# Patient Record
Sex: Male | Born: 1935 | Race: White | Hispanic: No | Marital: Married | Smoking: Never smoker
Health system: Southern US, Community
[De-identification: ages and names within clinical notes are randomized; demographics above are authoritative.]

## PROBLEM LIST (undated history)

## (undated) DIAGNOSIS — R7309 Other abnormal glucose: Secondary | ICD-10-CM

## (undated) DIAGNOSIS — E559 Vitamin D deficiency, unspecified: Secondary | ICD-10-CM

## (undated) DIAGNOSIS — H5702 Anisocoria: Secondary | ICD-10-CM

## (undated) DIAGNOSIS — E785 Hyperlipidemia, unspecified: Secondary | ICD-10-CM

## (undated) DIAGNOSIS — K219 Gastro-esophageal reflux disease without esophagitis: Secondary | ICD-10-CM

## (undated) DIAGNOSIS — D649 Anemia, unspecified: Secondary | ICD-10-CM

## (undated) HISTORY — DX: Gastro-esophageal reflux disease without esophagitis: K21.9

## (undated) HISTORY — DX: Anisocoria: H57.02

## (undated) HISTORY — DX: Hyperlipidemia, unspecified: E78.5

## (undated) HISTORY — DX: Other abnormal glucose: R73.09

## (undated) HISTORY — DX: Anemia, unspecified: D64.9

## (undated) HISTORY — PX: CATARACT EXTRACTION: SUR2

## (undated) HISTORY — DX: Vitamin D deficiency, unspecified: E55.9

## (undated) HISTORY — PX: COLONOSCOPY: SHX174

## (undated) SURGERY — VIDEO BRONCHOSCOPY WITHOUT FLUORO
Anesthesia: General

---

## 1996-08-30 HISTORY — PX: OTHER SURGICAL HISTORY: SHX169

## 2001-10-21 ENCOUNTER — Ambulatory Visit (HOSPITAL_COMMUNITY): Admission: RE | Admit: 2001-10-21 | Discharge: 2001-10-21 | Payer: Self-pay | Admitting: Internal Medicine

## 2001-10-21 ENCOUNTER — Encounter: Payer: Self-pay | Admitting: Internal Medicine

## 2003-01-21 ENCOUNTER — Encounter: Admission: RE | Admit: 2003-01-21 | Discharge: 2003-01-21 | Payer: Self-pay | Admitting: Internal Medicine

## 2003-01-21 ENCOUNTER — Encounter: Payer: Self-pay | Admitting: Internal Medicine

## 2003-01-25 ENCOUNTER — Encounter: Admission: RE | Admit: 2003-01-25 | Discharge: 2003-01-25 | Payer: Self-pay | Admitting: Internal Medicine

## 2003-01-25 ENCOUNTER — Encounter: Payer: Self-pay | Admitting: Internal Medicine

## 2004-09-25 ENCOUNTER — Encounter (INDEPENDENT_AMBULATORY_CARE_PROVIDER_SITE_OTHER): Payer: Self-pay | Admitting: Specialist

## 2004-09-25 ENCOUNTER — Ambulatory Visit (HOSPITAL_COMMUNITY): Admission: RE | Admit: 2004-09-25 | Discharge: 2004-09-25 | Payer: Self-pay | Admitting: *Deleted

## 2004-10-13 ENCOUNTER — Ambulatory Visit: Payer: Self-pay | Admitting: Internal Medicine

## 2004-10-22 ENCOUNTER — Ambulatory Visit: Payer: Self-pay

## 2004-11-03 ENCOUNTER — Ambulatory Visit: Payer: Self-pay | Admitting: Internal Medicine

## 2004-11-10 ENCOUNTER — Ambulatory Visit: Payer: Self-pay | Admitting: Internal Medicine

## 2005-01-26 ENCOUNTER — Ambulatory Visit: Payer: Self-pay | Admitting: Internal Medicine

## 2005-02-19 ENCOUNTER — Ambulatory Visit: Payer: Self-pay | Admitting: Internal Medicine

## 2005-03-11 ENCOUNTER — Encounter: Admission: RE | Admit: 2005-03-11 | Discharge: 2005-06-09 | Payer: Self-pay | Admitting: Internal Medicine

## 2006-02-04 ENCOUNTER — Ambulatory Visit: Payer: Self-pay | Admitting: Internal Medicine

## 2006-02-25 ENCOUNTER — Ambulatory Visit: Payer: Self-pay | Admitting: Internal Medicine

## 2006-04-05 ENCOUNTER — Ambulatory Visit: Payer: Self-pay | Admitting: Internal Medicine

## 2006-10-05 ENCOUNTER — Ambulatory Visit: Payer: Self-pay | Admitting: Internal Medicine

## 2006-10-05 LAB — CONVERTED CEMR LAB
ALT: 25 units/L (ref 0–40)
AST: 27 units/L (ref 0–37)
Cholesterol: 179 mg/dL (ref 0–200)
HDL: 36.1 mg/dL — ABNORMAL LOW (ref >39.0)
Hgb A1c MFr Bld: 6.2 % — ABNORMAL HIGH (ref 4.6–6.0)
LDL Cholesterol: 106 mg/dL — ABNORMAL HIGH (ref 0–99)
Total CHOL/HDL Ratio: 5
Triglycerides: 183 mg/dL — ABNORMAL HIGH (ref 0–149)
VLDL: 37 mg/dL (ref 0–40)
Vit D, 1,25-Dihydroxy: 10 — ABNORMAL LOW (ref 20–57)

## 2006-11-23 ENCOUNTER — Ambulatory Visit: Payer: Self-pay | Admitting: Internal Medicine

## 2008-02-21 ENCOUNTER — Encounter: Payer: Self-pay | Admitting: Internal Medicine

## 2008-03-12 ENCOUNTER — Ambulatory Visit: Payer: Self-pay | Admitting: Internal Medicine

## 2009-02-24 ENCOUNTER — Encounter: Payer: Self-pay | Admitting: Internal Medicine

## 2009-03-17 ENCOUNTER — Ambulatory Visit: Payer: Self-pay | Admitting: Internal Medicine

## 2009-03-17 DIAGNOSIS — H5702 Anisocoria: Secondary | ICD-10-CM | POA: Insufficient documentation

## 2009-03-17 DIAGNOSIS — E785 Hyperlipidemia, unspecified: Secondary | ICD-10-CM

## 2009-03-17 DIAGNOSIS — R7309 Other abnormal glucose: Secondary | ICD-10-CM | POA: Insufficient documentation

## 2009-03-17 DIAGNOSIS — R9431 Abnormal electrocardiogram [ECG] [EKG]: Secondary | ICD-10-CM

## 2009-03-17 DIAGNOSIS — E559 Vitamin D deficiency, unspecified: Secondary | ICD-10-CM

## 2009-03-17 DIAGNOSIS — E781 Pure hyperglyceridemia: Secondary | ICD-10-CM

## 2009-03-26 ENCOUNTER — Telehealth (INDEPENDENT_AMBULATORY_CARE_PROVIDER_SITE_OTHER): Payer: Self-pay | Admitting: *Deleted

## 2009-03-26 ENCOUNTER — Encounter (INDEPENDENT_AMBULATORY_CARE_PROVIDER_SITE_OTHER): Payer: Self-pay | Admitting: *Deleted

## 2009-04-02 ENCOUNTER — Encounter: Payer: Self-pay | Admitting: Internal Medicine

## 2009-06-12 ENCOUNTER — Ambulatory Visit: Payer: Self-pay | Admitting: Internal Medicine

## 2009-06-13 ENCOUNTER — Telehealth (INDEPENDENT_AMBULATORY_CARE_PROVIDER_SITE_OTHER): Payer: Self-pay | Admitting: *Deleted

## 2009-06-13 ENCOUNTER — Encounter (INDEPENDENT_AMBULATORY_CARE_PROVIDER_SITE_OTHER): Payer: Self-pay | Admitting: *Deleted

## 2009-06-13 LAB — CONVERTED CEMR LAB
Cholesterol: 167 mg/dL (ref 0–200)
Direct LDL: 70.5 mg/dL
Triglycerides: 403 mg/dL — ABNORMAL HIGH (ref 0.0–149.0)

## 2009-07-02 ENCOUNTER — Telehealth: Payer: Self-pay | Admitting: Internal Medicine

## 2009-08-11 ENCOUNTER — Ambulatory Visit: Payer: Self-pay | Admitting: Internal Medicine

## 2009-08-30 HISTORY — PX: PENILE PROSTHESIS IMPLANT: SHX240

## 2009-10-01 ENCOUNTER — Ambulatory Visit: Payer: Self-pay | Admitting: Internal Medicine

## 2009-10-06 LAB — CONVERTED CEMR LAB
AST: 26 units/L (ref 0–37)
Albumin: 4.1 g/dL (ref 3.5–5.2)
Alkaline Phosphatase: 58 units/L (ref 39–117)
Bilirubin, Direct: 0.2 mg/dL (ref 0.0–0.3)
Total Protein: 7 g/dL (ref 6.0–8.3)

## 2010-01-20 ENCOUNTER — Ambulatory Visit: Payer: Self-pay | Admitting: Internal Medicine

## 2010-01-20 DIAGNOSIS — K219 Gastro-esophageal reflux disease without esophagitis: Secondary | ICD-10-CM

## 2010-01-20 DIAGNOSIS — M7580 Other shoulder lesions, unspecified shoulder: Secondary | ICD-10-CM

## 2010-01-21 ENCOUNTER — Encounter: Admission: RE | Admit: 2010-01-21 | Discharge: 2010-02-18 | Payer: Self-pay | Admitting: Internal Medicine

## 2010-01-22 ENCOUNTER — Encounter: Payer: Self-pay | Admitting: Internal Medicine

## 2010-01-27 LAB — CONVERTED CEMR LAB
AST: 29 units/L (ref 0–37)
Albumin: 4 g/dL (ref 3.5–5.2)
Alkaline Phosphatase: 65 units/L (ref 39–117)
Bilirubin, Direct: 0.1 mg/dL (ref 0.0–0.3)
Calcium: 8.8 mg/dL (ref 8.4–10.5)
GFR calc non Af Amer: 77.94 mL/min (ref >60)
Glucose, Bld: 85 mg/dL (ref 70–99)
HDL: 36.2 mg/dL — ABNORMAL LOW (ref >39.00)
Potassium: 4.3 meq/L (ref 3.5–5.1)
Sodium: 141 meq/L (ref 135–145)
Total Bilirubin: 0.6 mg/dL (ref 0.3–1.2)
Total CHOL/HDL Ratio: 5
Triglycerides: 237 mg/dL — ABNORMAL HIGH (ref 0.0–149.0)

## 2010-02-17 ENCOUNTER — Telehealth (INDEPENDENT_AMBULATORY_CARE_PROVIDER_SITE_OTHER): Payer: Self-pay | Admitting: *Deleted

## 2010-02-18 ENCOUNTER — Encounter: Payer: Self-pay | Admitting: Internal Medicine

## 2010-03-10 ENCOUNTER — Encounter: Admission: RE | Admit: 2010-03-10 | Discharge: 2010-03-10 | Payer: Self-pay | Admitting: Urology

## 2010-03-13 ENCOUNTER — Ambulatory Visit (HOSPITAL_BASED_OUTPATIENT_CLINIC_OR_DEPARTMENT_OTHER): Admission: RE | Admit: 2010-03-13 | Discharge: 2010-03-13 | Payer: Self-pay | Admitting: Urology

## 2010-07-14 ENCOUNTER — Ambulatory Visit: Payer: Self-pay | Admitting: Internal Medicine

## 2010-07-20 LAB — CONVERTED CEMR LAB
HDL: 33.5 mg/dL — ABNORMAL LOW (ref >39.00)
Hgb A1c MFr Bld: 6.9 % — ABNORMAL HIGH (ref 4.6–6.5)
LDL Cholesterol: 96 mg/dL (ref 0–99)
Total CHOL/HDL Ratio: 5
Triglycerides: 173 mg/dL — ABNORMAL HIGH (ref 0.0–149.0)
VLDL: 34.6 mg/dL (ref 0.0–40.0)

## 2010-07-31 ENCOUNTER — Ambulatory Visit: Payer: Self-pay | Admitting: Internal Medicine

## 2010-07-31 ENCOUNTER — Ambulatory Visit: Payer: Self-pay | Admitting: Cardiovascular Disease

## 2010-07-31 DIAGNOSIS — R6883 Chills (without fever): Secondary | ICD-10-CM | POA: Insufficient documentation

## 2010-07-31 DIAGNOSIS — E119 Type 2 diabetes mellitus without complications: Secondary | ICD-10-CM | POA: Insufficient documentation

## 2010-07-31 DIAGNOSIS — R000 Tachycardia, unspecified: Secondary | ICD-10-CM

## 2010-08-03 ENCOUNTER — Encounter: Payer: Self-pay | Admitting: Internal Medicine

## 2010-08-03 ENCOUNTER — Ambulatory Visit: Payer: Self-pay | Admitting: Internal Medicine

## 2010-08-03 DIAGNOSIS — N390 Urinary tract infection, site not specified: Secondary | ICD-10-CM

## 2010-08-03 DIAGNOSIS — D649 Anemia, unspecified: Secondary | ICD-10-CM

## 2010-08-03 DIAGNOSIS — R918 Other nonspecific abnormal finding of lung field: Secondary | ICD-10-CM

## 2010-08-03 LAB — CONVERTED CEMR LAB
BUN: 19 mg/dL (ref 6–23)
Basophils Absolute: 0 10*3/uL (ref 0.0–0.1)
Basophils Relative: 0 % (ref 0.0–3.0)
Chloride: 102 meq/L (ref 96–112)
Eosinophils Absolute: 0.2 10*3/uL (ref 0.0–0.7)
GFR calc non Af Amer: 60.72 mL/min (ref >60)
Lymphocytes Relative: 7.3 % — ABNORMAL LOW (ref 12.0–46.0)
MCHC: 33.3 g/dL (ref 30.0–36.0)
MCV: 84.7 fL (ref 78.0–100.0)
Monocytes Absolute: 0.5 10*3/uL (ref 0.1–1.0)
Neutrophils Relative %: 85 % — ABNORMAL HIGH (ref 43.0–77.0)
Platelets: 271 10*3/uL (ref 150.0–400.0)
Potassium: 3.8 meq/L (ref 3.5–5.1)
RBC: 4.41 M/uL (ref 4.22–5.81)
RDW: 15.1 % — ABNORMAL HIGH (ref 11.5–14.6)
Relative Index: 1.4 (ref 0.0–2.5)
Saturation Ratios: 10.1 % — ABNORMAL LOW (ref 20.0–50.0)
TSH: 2.38 microintl units/mL (ref 0.35–5.50)
Total CK: 139 units/L (ref 7–232)
Transferrin: 345.6 mg/dL (ref 212.0–360.0)

## 2010-08-04 ENCOUNTER — Telehealth (INDEPENDENT_AMBULATORY_CARE_PROVIDER_SITE_OTHER): Payer: Self-pay | Admitting: *Deleted

## 2010-08-04 ENCOUNTER — Telehealth: Payer: Self-pay | Admitting: Internal Medicine

## 2010-08-12 ENCOUNTER — Telehealth (INDEPENDENT_AMBULATORY_CARE_PROVIDER_SITE_OTHER): Payer: Self-pay | Admitting: *Deleted

## 2010-08-13 ENCOUNTER — Encounter: Payer: Self-pay | Admitting: Internal Medicine

## 2010-08-13 ENCOUNTER — Ambulatory Visit: Payer: Self-pay | Admitting: Internal Medicine

## 2010-08-13 LAB — CONVERTED CEMR LAB
Bilirubin Urine: NEGATIVE
Glucose, Urine, Semiquant: NEGATIVE
Ketones, urine, test strip: NEGATIVE
Protein, U semiquant: NEGATIVE
Urobilinogen, UA: 0.2
pH: 6

## 2010-09-01 ENCOUNTER — Telehealth (INDEPENDENT_AMBULATORY_CARE_PROVIDER_SITE_OTHER): Payer: Self-pay | Admitting: *Deleted

## 2010-09-15 ENCOUNTER — Other Ambulatory Visit: Payer: Self-pay | Admitting: Internal Medicine

## 2010-09-15 ENCOUNTER — Ambulatory Visit
Admission: RE | Admit: 2010-09-15 | Discharge: 2010-09-15 | Payer: Self-pay | Source: Home / Self Care | Attending: Internal Medicine | Admitting: Internal Medicine

## 2010-09-15 LAB — CBC WITH DIFFERENTIAL/PLATELET
Basophils Absolute: 0 10*3/uL (ref 0.0–0.1)
Basophils Relative: 0.4 % (ref 0.0–3.0)
Eosinophils Absolute: 0.2 10*3/uL (ref 0.0–0.7)
Eosinophils Relative: 2.6 % (ref 0.0–5.0)
HCT: 37.3 % — ABNORMAL LOW (ref 39.0–52.0)
Hemoglobin: 12.6 g/dL — ABNORMAL LOW (ref 13.0–17.0)
Lymphocytes Relative: 25.4 % (ref 12.0–46.0)
Lymphs Abs: 2.1 10*3/uL (ref 0.7–4.0)
MCHC: 33.9 g/dL (ref 30.0–36.0)
MCV: 84.2 fl (ref 78.0–100.0)
Monocytes Absolute: 0.6 10*3/uL (ref 0.1–1.0)
Monocytes Relative: 7.2 % (ref 3.0–12.0)
Neutro Abs: 5.3 10*3/uL (ref 1.4–7.7)
Neutrophils Relative %: 64.4 % (ref 43.0–77.0)
Platelets: 285 10*3/uL (ref 150.0–400.0)
RBC: 4.43 Mil/uL (ref 4.22–5.81)
RDW: 16.3 % — ABNORMAL HIGH (ref 11.5–14.6)
WBC: 8.3 10*3/uL (ref 4.5–10.5)

## 2010-09-15 LAB — IBC PANEL
Iron: 65 ug/dL (ref 42–165)
Saturation Ratios: 13.2 % — ABNORMAL LOW (ref 20.0–50.0)
Transferrin: 351.2 mg/dL (ref 212.0–360.0)

## 2010-09-24 ENCOUNTER — Other Ambulatory Visit: Payer: Self-pay | Admitting: Dermatology

## 2010-09-27 LAB — CONVERTED CEMR LAB
ALT: 29 units/L (ref 0–53)
AST: 26 units/L (ref 0–37)
Albumin: 4 g/dL (ref 3.5–5.2)
Alkaline Phosphatase: 94 units/L (ref 39–117)
BUN: 15 mg/dL (ref 6–23)
Basophils Absolute: 0 10*3/uL (ref 0.0–0.1)
Basophils Relative: 0.1 % (ref 0.0–3.0)
Bilirubin, Direct: 0.1 mg/dL (ref 0.0–0.3)
CO2: 31 meq/L (ref 19–32)
Calcium: 9.3 mg/dL (ref 8.4–10.5)
Chloride: 106 meq/L (ref 96–112)
Cholesterol: 182 mg/dL (ref 0–200)
Creatinine, Ser: 0.9 mg/dL (ref 0.4–1.5)
Direct LDL: 68.1 mg/dL
Eosinophils Absolute: 0.2 10*3/uL (ref 0.0–0.7)
Eosinophils Relative: 2.1 % (ref 0.0–5.0)
GFR calc non Af Amer: 88.22 mL/min (ref >60)
Glucose, Bld: 81 mg/dL (ref 70–99)
HCT: 40 % (ref 39.0–52.0)
HDL: 37.7 mg/dL — ABNORMAL LOW (ref >39.00)
Hemoglobin: 13.5 g/dL (ref 13.0–17.0)
Hgb A1c MFr Bld: 6.1 % (ref 4.6–6.5)
Lymphocytes Relative: 29 % (ref 12.0–46.0)
Lymphs Abs: 2.5 10*3/uL (ref 0.7–4.0)
MCHC: 33.8 g/dL (ref 30.0–36.0)
MCV: 88.1 fL (ref 78.0–100.0)
Monocytes Absolute: 0.6 10*3/uL (ref 0.1–1.0)
Monocytes Relative: 7.5 % (ref 3.0–12.0)
Neutro Abs: 5.3 10*3/uL (ref 1.4–7.7)
Neutrophils Relative %: 61.3 % (ref 43.0–77.0)
Phosphorus: 4 mg/dL (ref 2.3–4.6)
Platelets: 226 10*3/uL (ref 150.0–400.0)
Potassium: 4.2 meq/L (ref 3.5–5.1)
RBC: 4.54 M/uL (ref 4.22–5.81)
RDW: 13.7 % (ref 11.5–14.6)
Sodium: 143 meq/L (ref 135–145)
TSH: 1.98 microintl units/mL (ref 0.35–5.50)
Total Bilirubin: 0.9 mg/dL (ref 0.3–1.2)
Total CHOL/HDL Ratio: 5
Total Protein: 7.2 g/dL (ref 6.0–8.3)
Triglycerides: 487 mg/dL — ABNORMAL HIGH (ref 0.0–149.0)
VLDL: 97.4 mg/dL — ABNORMAL HIGH (ref 0.0–40.0)
WBC: 8.6 10*3/uL (ref 4.5–10.5)

## 2010-09-29 NOTE — Progress Notes (Signed)
Summary: refill  Phone Note Refill Request Call back at Home Phone [phone]   Refills Requested: Medication #1:  FENOFIBRATE 160 MG TABS 1 by mouth at bedtime fax to [facility] [phone]  Initial call taken by: Okey Regal [place],  February 17, 2010 1:58 PM  Follow-up for Phone Call        Rx was printed and faxed to: [phone]  Follow-up by: Shonna Chock,  February 17, 2010 3:46 PM    Prescriptions: FENOFIBRATE 160 MG TABS (FENOFIBRATE) 1 by mouth at bedtime  #90 x 1   Entered by:   Shonna Chock   Authorized by:   Marga Melnick MD   Signed by:   Shonna Chock on 02/17/2010   Method used:   Print then Give to Patient   RxID:   [id number]

## 2010-09-29 NOTE — Progress Notes (Signed)
Summary: Med Concerns  Phone Note Call from Patient Call back at Home Phone [phone]   Caller: Patient Summary of Call: Patient called triage phone and indicated that Cipro is not at pharmacy. [facility]  RX resent./Chrae Malloy CMA  August 04, 2010 1:29 PM     Prescriptions: CIPROFLOXACIN HCL 500 MG TABS (CIPROFLOXACIN HCL) 1 two times a day  #20 x 0   Entered by:   Shonna Chock CMA   Authorized by:   Marga Melnick MD   Signed by:   Shonna Chock CMA on 08/04/2010   Method used:   Electronically to        [facility]  [phone]* (retail)       [address]       [place], [place]  [address]       Ph: [phone] or [phone]       Fax: [phone]   RxID:   [id number]

## 2010-09-29 NOTE — Miscellaneous (Signed)
Summary: Orders Update   Clinical Lists Changes  Orders: Added new Referral order of Radiology Referral (Radiology) - Signed    left message with spouse for the patient to call back to office. ASAP. Lucious Groves CMA  July 31, 2010 11:57 AM   Office has been in contact with the patient and he will come in on Monday per Hop. Lucious Groves CMA  July 31, 2010 4:45 PM

## 2010-09-29 NOTE — Progress Notes (Signed)
Summary: Results  Phone Note Outgoing Call   Details for Reason: These are normal. Please recheck the iron panel & CBC in 6-8 weeks ( 285.9) to rule out progressive anemia. Hopp Details of Request: Cipro was FAXed to [facility] Call: Left message on machine to call back to office. Lucious Groves CMA  August 04, 2010 8:45 AM   Returned call to patient on cell # [phone] and notified/scheduled lab appt. Lucious Groves CMA  August 04, 2010 9:55 AM

## 2010-09-29 NOTE — Assessment & Plan Note (Signed)
Summary: LEFT SHOULDER PAIN/RH......   Vital Signs:  Patient profile:   76 year old male Weight:      196.6 pounds BMI:     32.09 Temp:     98.3 degrees F oral Pulse rate:   72 / minute Resp:     16 per minute BP sitting:   114 / 70  (left arm) Cuff size:   large  Vitals Entered By: Shonna Chock (Jan 20, 2010 10:56 AM) CC: Left shoulder pain off/on x 1 month, Shoulder pain Comments REVIEWED MED LIST, PATIENT AGREED DOSE AND INSTRUCTION CORRECT    CC:  Left shoulder pain off/on x 1 month and Shoulder pain.  History of Present Illness:  Shoulder Pain      This is a 76 year old man who presents with Shoulder pain X 6 weeks .  The patient reports stiffness, but denies numbness, weakness, tingling, locking, impaired ROM, swelling, and redness.  The pain is located in the left shoulder.  The pain began acutely and without injury.  The patient describes the pain as intermittent and dull.  The pain is better with NSAIDS.   It is woese in evenings & early am. No PMH of MS issues  Allergies (verified): No Known Drug Allergies  Review of Systems General:  Denies chills, fever, sweats, and weight loss. GI:  Denies abdominal pain, bloody stools, dark tarry stools, and indigestion; PPI controls symptoms. GU:  Denies incontinence. Derm:  Denies lesion(s) and rash. Neuro:  Denies brief paralysis and weakness.  Physical Exam  General:  in no acute distress; alert,appropriate and cooperative throughout examination Eyes:  No corneal or conjunctival inflammation noted. OD pupil > OS ("from birth") Neck:  No deformities, masses, or tenderness noted. Full ROM w/o pain  Pulses:  R and L carotid,radial  pulses are full and equal bilaterally Extremities:  No clubbing, cyanosis, edema, or deformity noted .Normal full range of motion of shoulders but pain & crepitus on L.pain over L dorsal shoulder with opposition   Neurologic:  alert & oriented X3, strength normal in all extremities, and DTRs  symmetrical  but 0-1/2+ Skin:  Intact without suspicious lesions or rashes Cervical Nodes:  No lymphadenopathy noted Axillary Nodes:  No palpable lymphadenopathy Psych:  memory intact for recent and remote, normally interactive, and good eye contact.     Impression & Recommendations:  Problem # 1:  SHOULDER IMPINGEMENT SYNDROME, LEFT (ICD-726.2)  Orders: Physical Therapy Referral (PT) Prescription Created Electronically [phone])  Problem # 2:  GERD (ICD-530.81)  His updated medication list for this problem includes:    Omeprazole 20 Mg Cpdr (Omeprazole) .Marland Kitchen... 1 pill 30 min pre b'fast  Complete Medication List: 1)  Fenofibrate 160 Mg Tabs (Fenofibrate) .Marland Kitchen.. 1 by mouth at bedtime 2)  Omeprazole 20 Mg Cpdr (Omeprazole) .Marland Kitchen.. 1 pill 30 min pre b'fast 3)  Tramadol Hcl 50 Mg Tabs (Tramadol hcl) .... 1/2 -1  every 6 hrs as needed for pain  Patient Instructions: 1)  Avoid NSAIDS due to ERD.Avoid foods high in acid (tomatoes, citrus juices, spicy foods). Avoid eating within two hours of lying down or before exercising. Do not over eat; try smaller more frequent meals. Elevate head of bed twelve inches when sleeping. Glucosamine 1500 mg once daily to rehydrate soft tisses in L shoulder. Prescriptions: OMEPRAZOLE 20 MG CPDR (OMEPRAZOLE) 1 pill 30 min pre b'fast  #90 x 1   Entered and Authorized by:   Marga Melnick MD   Signed by:  Marga Melnick MD on 01/20/2010   Method used:   Print then Give to Patient   RxID:   [id number] TRAMADOL HCL 50 MG TABS (TRAMADOL HCL) 1/2 -1  every 6 hrs as needed for pain  #30 x 1   Entered and Authorized by:   Marga Melnick MD   Signed by:   Marga Melnick MD on 01/20/2010   Method used:   Faxed to ...       [facility]  [address]  [phone]* (retail)       [address]       [place], [place]  [address]       Ph: [phone] or [phone]       Fax: [phone]   RxID:   [id number]   Appended Document: LEFT  SHOULDER PAIN/RH....Marland KitchenMarland Kitchen

## 2010-09-29 NOTE — Assessment & Plan Note (Signed)
Summary: TO DISCUSS LABS AND OTHER//PH   Vital Signs:  Patient profile:   76 year old male Height:      65.75 inches (167.01 cm) Weight:      197.13 pounds (89.60 kg) BMI:     32.18 O2 Sat:      95 % on Room air Temp:     98.4 degrees F (36.89 degrees C) oral Pulse rate:   111 / minute Resp:     14 per minute BP sitting:   130 / 80  (left arm)  Vitals Entered By: Lucious Groves CMA (July 31, 2010 8:06 AM)  O2 Flow:  Room air CC: Discuss labs, cold, and rash/kb, Type 2 diabetes mellitus follow-up, URI symptoms Is Patient Diabetic? No Pain Assessment Patient in pain? no      Comments Patient notes that he has been having cold shakes, HA, fever, cough, and congestion. He denies mucous production./kb   CC:  Discuss labs, cold, and rash/kb, Type 2 diabetes mellitus follow-up, and URI symptoms.  History of Present Illness:  Hyperglycemia  Follow-Up      This is a 76 year old man who presents for  lab result  follow-up.  The patient reports weight gain of 5#, but denies polyuria, polydipsia, blurred vision, and numbness of extremities.  The patient denies the following symptoms: neuropathic pain, chest pain, vomiting, orthostatic symptoms, poor wound healing, intermittent claudication, vision loss, and foot ulcer.  Since the last visit the patient reports  variable  dietary compliance and  is not monitoring blood glucose. The patient reports having had eye care by an ophthalmologist 6 months ago; no retinopathy. A1c 6.9%( average sugar 151& risk 38%). Role of HFCS sugar in raising TG ( 173) discussed.      The patient also presents  incidentally with acute  RTI symptoms. On 11/30 he had chills,myalgias , athralgias,  ST , frontal headache & dry cough The patient reports sore throat, but denies nasal congestion, purulent nasal discharge, and earache.  The patient denies fever, dyspnea, and wheezing.  The patient also reports headache.  The patient denies the following risk factors for Strep  sinusitis: unilateral facial pain, tooth pain, and tender adenopathy. Residual weakness & fatigue.   Rx: none. He has had Flu shot. UTI treated by Urologist 3 weeks ago.In the context of this history , he was found to be tachycardic, initially up to 120.  Current Medications (verified): 1)  Fenofibrate 160 Mg Tabs (Fenofibrate) .Marland Kitchen.. 1 By Mouth At Bedtime 2)  Omeprazole 20 Mg Cpdr (Omeprazole) .Marland Kitchen.. 1 Pill 30 Min Pre B'fast  Allergies (verified): No Known Drug Allergies  Review of Systems General:  Complains of fatigue; denies sweats. CV:  Denies chest pain or discomfort, palpitations, shortness of breath with exertion, swelling of feet, and swelling of hands. Resp:  Denies chest pain with inspiration, shortness of breath, sputum productive, and wheezing. GI:  Denies diarrhea. GU:  Denies discharge, dysuria, and hematuria.  Physical Exam  General:  in no acute distress; alert,appropriate and cooperative throughout examination Eyes:  No corneal or conjunctival inflammation noted. EOMI. Anisocoria (chronic) Ears:  External ear exam shows no significant lesions or deformities.  Otoscopic examination reveals clear canals, tympanic membranes are intact bilaterally without bulging, retraction, inflammation or discharge. Hearing is grossly normal bilaterally. Nose:  External nasal examination shows no deformity or inflammation. Nasal mucosa are pink and moist without lesions or exudates. Mouth:  Oral mucosa and oropharynx without lesions or exudates.  Teeth  in good repair. Neck:  No deformities, masses, or tenderness noted. Supple Lungs:  Normal respiratory effort, chest expands symmetrically. Lungs are clear to auscultation, no crackles or wheezes. Heart:  Normal rate and regular rhythm. S1 and S2 normal without gallop, murmur, click, rub . S4 Abdomen:  Bowel sounds positive,abdomen soft and non-tender without masses, organomegaly or hernias noted. Pulses:  R and L carotid,radial,dorsalis pedis  and posterior tibial pulses are full and equal bilaterally Extremities:  No clubbing, cyanosis, edema. Good nail health Neurologic:  alert & oriented X3 and sensation intact to light touch over feet .   Skin:  Intact without suspicious lesions. Hands dry with slight exfoliation Cervical Nodes:  No lymphadenopathy noted Axillary Nodes:  No palpable lymphadenopathy Psych:  memory intact for recent and remote, normally interactive, and good eye contact.     Impression & Recommendations:  Problem # 1:  DIABETES MELLITUS, TYPE II (ICD-250.00)   Here for routine F/U;fair to poor control Orders: TLB-BMP (Basic Metabolic Panel-BMET) (80048-METABOL)  Problem # 2:  CHILLS WITHOUT FEVER (ICD-780.64) Acute , incidental history unrelated to #1. History of recent UTI Orders: Prescription Created Electronically [phone]) UA Dipstick w/o Micro (manual) (21308) T-Culture, Urine ([id number]) TLB-CBC Platelet - w/Differential (85025-CBCD)  Problem # 3:  TACHYCARDIA (ICD-785.0) Asymptomatic physical finding Orders: EKG w/ Interpretation (93000): diffuse NS ST-T changes , slight progression TLB-BMP (Basic Metabolic Panel-BMET) (80048-METABOL) TLB-TSH (Thyroid Stimulating Hormone) (84443-TSH)  Problem # 4:  UNSPECIFIED VITAMIN D DEFICIENCY (ICD-268.9) PMH of  Complete Medication List: 1)  Fenofibrate 160 Mg Tabs (Fenofibrate) .Marland Kitchen.. 1 by mouth at bedtime 2)  Omeprazole 20 Mg Cpdr (Omeprazole) .Marland Kitchen.. 1 pill 30 min pre b'fast 3)  Azithromycin 250 Mg Tabs (Azithromycin) .... As per pack  Other Orders: T-D-[facility]) [phone]) TLB-Cardiac Panel [id number])  Patient Instructions: 1)  Check your blood sugars regularly. If your readings are usually above : 150 or below 90 you should contact our office. Vit D3 1000 International Units once daily . 2)  Please schedule a follow-up appointment in 3 months. 3)  Lipid Panel prior to visit, ICD-9:277.7 4)  HbgA1C prior to visit,  ICD-9:250.00 5)  Urine Microalbumin prior to visit, ICD-9:250.00. To ER if symptoms recur ; take this record. Prescriptions: AZITHROMYCIN 250 MG TABS (AZITHROMYCIN) as per pack  #1 x 0   Entered and Authorized by:   Marga Melnick MD   Signed by:   Marga Melnick MD on 07/31/2010   Method used:   F[facility]ed to ...       [facility]  [address]  [phone]* (retail)       [address]       [place], [place]  [address]       Ph: [phone] or [phone]       Fax: [phone]   RxID:   [id number]    Orders Added: 1)  Est. Patient Level IV [76283] 2)  EKG w/ Interpretation [93000] 3)  T-D-[facility]) [85379-DIMR] 4)  Prescription Created Electronically [G8553] 5)  UA Dipstick w/o Micro (manual) [81002] 6)  T-Culture, Urine [[id number]] 7)  TLB-CBC Platelet - w/Differential [85025-CBCD] 8)  TLB-BMP (Basic Metabolic Panel-BMET) [80048-METABOL] 9)  TLB-TSH (Thyroid Stimulating Hormone) [84443-TSH] 10)  TLB-Cardiac Panel [82550_82553-CARD]

## 2010-09-29 NOTE — Miscellaneous (Signed)
Summary: PT Discharge/[facility]  PT Discharge/[facility]   Imported By: Lanelle Bal 02/26/2010 14:43:23  _____________________________________________________________________  External Attachment:    Type:   Image     Comment:   External Document

## 2010-09-29 NOTE — Miscellaneous (Signed)
Summary: PT Initial Summary/[facility]  PT Initial [facility] ([facility])   Imported By: Lanelle Bal 02/02/2010 13:43:59  _____________________________________________________________________  External Attachment:    Type:   Image     Comment:   External Document

## 2010-09-29 NOTE — Assessment & Plan Note (Signed)
Summary: DISCUSS CT AND LABS PER HOP/KB   Vital Signs:  Patient profile:   76 year old male Weight:      195.6 pounds BMI:     31.93 Pulse rate:   84 / minute Resp:     15 per minute BP sitting:   138 / 80  (left arm) Cuff size:   large  Vitals Entered By: Shonna Chock CMA (August 03, 2010 10:03 AM) CC: Discuss CT and Labs (copies given) , Heartburn, Dysuria, COPD follow-up   CC:  Discuss CT and Labs (copies given) , Heartburn, Dysuria, and COPD follow-up.  History of Present Illness:    He is improved , ie" less tired". CT results & labs reviewed ; mild anemia present since 07/10. Gram neg rods > 100,000 on C&S.   The patient denies acid reflux, sour taste in mouth, epigastric pain, chest pain, trouble swallowing, and weight loss.  The patient denies the following alarm features: melena, dysphagia, hematemesis, and vomiting.  EGD & colonoscopy  neg 2006(?). .  The patient denies burning with urination, urinary frequency, urgency, hematuria, and penile discharge but he self caths.  Associated symptoms include ?  fever over weekend.  The patient denies the following associated symptoms: nausea, vomiting, shaking chills, flank pain, abdominal pain, back pain, and pelvic pain.  S/P penile implant Summer 2011.      The patient denies shortness of breath, chest tightness, wheezing, cough, increased sputum, and nocturnal awakening.  He never smoked ;he grew up in [place]. No PMH of lung disease ; his father had emphysema.   Current Medications (verified): 1)  Fenofibrate 160 Mg Tabs (Fenofibrate) .Marland Kitchen.. 1 By Mouth At Bedtime 2)  Omeprazole 20 Mg Cpdr (Omeprazole) .Marland Kitchen.. 1 Pill 30 Min Pre B'fast 3)  Azithromycin 250 Mg Tabs (Azithromycin) .... As Per Pack 4)  Onetouch Ultrasoft Lancets  Misc (Lancets) .... Use As Directed 5)  Onetouch Ultra Blue  Strp (Glucose Blood) .... Use As Directed  Allergies (verified): No Known Drug Allergies  Past History:  Past Surgical History: Bladder cancer  surgery , Dr Marcello Fennel ; Colonoscopy neg 2006, Dr Virginia Rochester Cataract extraction Penile Implant Summer 2011, Dr Marcello Fennel  Physical Exam  General:  in no acute distress; alert,appropriate and cooperative throughout examination Lungs:  Normal respiratory effort, chest expands symmetrically. Lungs are clear to auscultation, no crackles or wheezes. Heart:  Normal rate and regular rhythm. S1 and S2 normal without gallop, murmur, click, rub .S4  Abdomen:  Bowel sounds positive,abdomen soft and non-tender without masses, organomegaly or hernias noted. Cervical Nodes:  No lymphadenopathy noted Axillary Nodes:  No palpable lymphadenopathy   Impression & Recommendations:  Problem # 1:  OTHER NONSPECIFIC ABNORMAL FINDING OF LUNG FIELD (ICD-793.19) possible remote Coccidiomycosis  Problem # 2:  ANEMIA, MILD (ICD-285.9)  Orders: Venipuncture (16109) TLB-B12 + Folate Pnl ([id number]/NWG) TLB-IBC Pnl (Iron/FE;Transferrin) (83550-IBC)  Problem # 3:  UTI (ICD-599.0) final C&S pending His updated medication list for this problem includes:    Azithromycin 250 Mg Tabs (Azithromycin) .Marland Kitchen... As per pack  Complete Medication List: 1)  Fenofibrate 160 Mg Tabs (Fenofibrate) .Marland Kitchen.. 1 by mouth at bedtime 2)  Omeprazole 20 Mg Cpdr (Omeprazole) .Marland Kitchen.. 1 pill 30 min pre b'fast 3)  Azithromycin 250 Mg Tabs (Azithromycin) .... As per pack 4)  Onetouch Ultrasoft Lancets Misc (Lancets) .... Use as directed 5)  Onetouch Ultra Blue Strp (Glucose blood) .... Use as directed  Patient Instructions: 1)  CAT scan in 4 months ;  please call in 11/2010 to schedule . Complete stool cards. Rx will be called into [facility] [address] once culture is complete.   Orders Added: 1)  Est. Patient Level III [45409] 2)  Venipuncture [36415] 3)  TLB-B12 + Folate Pnl [82746_82607-B12/FOL] 4)  TLB-IBC Pnl (Iron/FE;Transferrin) [83550-IBC]  Appended Document: DISCUSS CT AND LABS PER HOP/KB

## 2010-10-01 NOTE — Progress Notes (Signed)
Summary: RE-check ua  Phone Note Call from Patient Call back at [phone]   Summary of Call: Patient left message on triage that he is almost done with ABX and needs urine re-checked. Do you want him to do it now or how many days should he wait after completion of ABX? Please advise. Initial call taken by: Lucious Groves CMA,  August 12, 2010 3:29 PM  Follow-up for Phone Call        wait 5-7 days unless having fever or GU symptoms Follow-up by: Marga Melnick MD,  August 12, 2010 3:43 PM  Additional Follow-up for Phone Call Additional follow up Details #1::        Patient scheduled for tomorrow to have udip and culture.  Patient states it is 5-7 days after  Additional Follow-up by: Shonna Chock CMA,  August 12, 2010 4:01 PM

## 2010-10-01 NOTE — Progress Notes (Signed)
Summary: REFILL  Phone Note Refill Request Message from:  Patient on September 01, 2010 11:19 AM  Refills Requested: Medication #1:  OMEPRAZOLE 20 MG CPDR 1 pill 30 min pre b'fast   Dosage confirmed as above?Dosage Confirmed   Supply Requested: 3 months [facility] [phone] PT ID: [id number]  Next Appointment Scheduled: 09/15/10 Initial call taken by: Lavell Islam,  September 01, 2010 11:21 AM  Follow-up for Phone Call        RX was faxed to [phone] Follow-up by: Shonna Chock CMA,  September 01, 2010 2:20 PM    Prescriptions: OMEPRAZOLE 20 MG CPDR (OMEPRAZOLE) 1 pill 30 min pre b'fast  #90 x 1   Entered by:   Shonna Chock CMA   Authorized by:   Marga Melnick MD   Signed by:   Shonna Chock CMA on 09/01/2010   Method used:   Print then Give to Patient   RxID:   [id number]

## 2010-10-12 ENCOUNTER — Encounter: Payer: Self-pay | Admitting: Internal Medicine

## 2010-10-13 ENCOUNTER — Encounter (HOSPITAL_COMMUNITY): Payer: BC Managed Care – PPO

## 2010-10-13 ENCOUNTER — Other Ambulatory Visit: Payer: Self-pay | Admitting: Urology

## 2010-10-13 DIAGNOSIS — Z01812 Encounter for preprocedural laboratory examination: Secondary | ICD-10-CM | POA: Insufficient documentation

## 2010-10-13 LAB — CBC
HCT: 39.1 % (ref 39.0–52.0)
Hemoglobin: 12.9 g/dL — ABNORMAL LOW (ref 13.0–17.0)
MCH: 27.9 pg (ref 26.0–34.0)
MCHC: 33 g/dL (ref 30.0–36.0)
MCV: 84.6 fL (ref 78.0–100.0)
Platelets: 352 K/uL (ref 150–400)
RBC: 4.62 MIL/uL (ref 4.22–5.81)
RDW: 15.2 % (ref 11.5–15.5)
WBC: 10.7 K/uL — ABNORMAL HIGH (ref 4.0–10.5)

## 2010-10-13 LAB — SURGICAL PCR SCREEN
MRSA, PCR: NEGATIVE
Staphylococcus aureus: NEGATIVE

## 2010-10-14 ENCOUNTER — Ambulatory Visit (HOSPITAL_COMMUNITY)
Admission: RE | Admit: 2010-10-14 | Discharge: 2010-10-14 | Disposition: A | Discharge status: Home / Self Care | Payer: BC Managed Care – PPO | Source: Ambulatory Visit | Attending: Urology | Admitting: Urology

## 2010-10-14 DIAGNOSIS — Z8551 Personal history of malignant neoplasm of bladder: Secondary | ICD-10-CM | POA: Insufficient documentation

## 2010-10-14 DIAGNOSIS — Y831 Surgical operation with implant of artificial internal device as the cause of abnormal reaction of the patient, or of later complication, without mention of misadventure at the time of the procedure: Secondary | ICD-10-CM | POA: Insufficient documentation

## 2010-10-14 DIAGNOSIS — K219 Gastro-esophageal reflux disease without esophagitis: Secondary | ICD-10-CM | POA: Insufficient documentation

## 2010-10-14 DIAGNOSIS — Z01812 Encounter for preprocedural laboratory examination: Secondary | ICD-10-CM | POA: Insufficient documentation

## 2010-10-14 DIAGNOSIS — T8389XA Other specified complication of genitourinary prosthetic devices, implants and grafts, initial encounter: Secondary | ICD-10-CM | POA: Insufficient documentation

## 2010-10-19 NOTE — Op Note (Signed)
  Henry [name], Henry [name]             ACCOUNT NO.:  [id number]  MEDICAL RECORD NO.:  [id number]           PATIENT TYPE:  O  LOCATION:  DAYL                         FACILITY:  [facility]  PHYSICIAN:  Bertram Millard. Jonie Burdell, M.D.DATE OF BIRTH:  22-Apr-1935  DATE OF PROCEDURE: DATE OF DISCHARGE:                              OPERATIVE REPORT   PREOPERATIVE DIAGNOSIS:  Extruded inflatable penile prosthesis.  POSTOPERATIVE DIAGNOSES:  Extruded inflatable penile prosthesis.  PRINCIPAL PROCEDURE:  Explant of inflatable penile prosthesis.  SURGEON:  Bertram Millard. Ekam Besson, M.D.  ANESTHESIA:  General with LMA.  COMPLICATIONS:  None.  SPECIMENS:  None.  ESTIMATED BLOOD LOSS:  Minimal.  BRIEF HISTORY:  This nice 76 year old gentleman has a history of bladder cancer.  He underwent placement of an Ambicor 2-piece inflatable penile prosthesis in July 2011.  The patient had a delayed recovery but had been using this implant successfully.  Recently, he discovered this prosthesis pump is coming through his scrotal wall.  Presented recently to the office.  As Dr. Patsi Sears is not in the office this week, it was recommended that he have urgent removal of this and presents at this time for explant.  Risks and complications of the procedure have been discussed with the patient, as well as a possibility that this may not be able to be replaced in the future.  He desires to proceed.  DESCRIPTION OF PROCEDURE:  The patient was identified in the holding area and received preoperative IV antibiotics.  I introduced myself and went over the procedure as well.  He was taken to the operating room where general anesthetic was administered using LMA.  He was placed in the dorsal lithotomy position.  Genitalia and perineum were prepped and draped.  Time-out was then performed.  I then incised over both inferior and superior to the extruded prosthesis pump, and then with electrocautery, dissected down along  the tubing to the ventral portion of each corporal body, following the tubing.  I then opened the corpora with small corporotomies bilaterally. The prosthesis and the cylinders in the corresponding 1 cm rear tip extenders were both removed.  I then copiously irrigated corporal bodies with antibiotic irrigation.  They were then reapproximated using 2-0 Vicryl sutures.  I then debrided the patient's penoscrotal wound with electrocautery and freed up flaps that allowed a generous, tension-free closure.  Subcutaneous sutures were placed with 2-0 Vicryl, and then I closed the skin edges using 4-0 Monocryl placed in interrupted horizontal mattress fashion along the course of the incision. Hemostasis was adequate.  Fluffs were placed in a compressive jockstrap.  The patient tolerated procedure well.  He was awakened and then taken to PACU in stable condition.  He will follow up in approximately 1 to 2 weeks with Dr. Patsi Sears.  He was sent home on Percocet and the remainder of Cipro.     Bertram Millard. Retta Diones, M.D.  cc: Dr. Patsi Sears    SMD/MEDQ  D:  10/14/2010  T:  10/14/2010  Job:  [id number]  Electronically Signed by Marcine Matar M.D. on 10/19/2010 10:43:42 AM

## 2010-10-22 ENCOUNTER — Encounter: Payer: Self-pay | Admitting: Internal Medicine

## 2010-10-27 NOTE — Letter (Signed)
Summary: [facility]  [facility]   Imported By: Maryln Gottron 10/21/2010 11:15:48  _____________________________________________________________________  External Attachment:    Type:   Image     Comment:   External Document

## 2010-11-05 NOTE — Letter (Signed)
Summary: [facility]  [facility]   Imported By: Lanelle Bal 10/28/2010 14:19:56  _____________________________________________________________________  External Attachment:    Type:   Image     Comment:   External Document

## 2010-11-14 LAB — POCT HEMOGLOBIN-HEMACUE: Hemoglobin: 13.8 g/dL (ref 13.0–17.0)

## 2011-01-15 NOTE — Op Note (Signed)
[name], [name]             ACCOUNT NO.:  [id number]   MEDICAL RECORD NO.:  [id number]          PATIENT TYPE:  AMB   LOCATION:  ENDO                         FACILITY:  [facility]   PHYSICIAN:  Georgiana Spinner, M.D.    DATE OF BIRTH:  11-09-1934   DATE OF PROCEDURE:  09/25/2004  DATE OF DISCHARGE:                                 OPERATIVE REPORT   PROCEDURE:  Upper endoscopy.   INDICATIONS:  GERD.   ANESTHESIA:  Demerol 60 mg, Versed 6 mg.   PROCEDURE:  With patient mildly sedated in the left lateral decubitus  position, the Olympus videoscopic endoscope was inserted in the mouth and  passed under direct vision through the esophagus, which appeared normal.  There was a question of inflammatory changes or Barrett's seen distally,  which was photographed and biopsied.  We entered into the stomach.  Fundus,  body, antrum, duodenal bulb, second portion of duodenum all appeared normal.  From this point the endoscope was slowly withdrawn taking circumferential  views of duodenal mucosa until the endoscope was then pulled back into the  stomach, placed in retroflexion to view the stomach from below.  The  endoscope was straightened and withdrawn taking circumferential views of the  remaining gastric and esophageal mucosa.  The patient's vital signs and  pulse oximetry remained stable.  The patient tolerated procedure well  without apparent complications.   FINDINGS:  Mild inflammatory changes of distal esophagus.   Await biopsy report.  The patient will call me for results and follow up  with me as an outpatient.  Will continue on a PPI.  He had gotten Aciphex  but apparently tier 3 with his insurance company, and we will see if we can  find something that works that is also less extensive for him.      GMO/MEDQ  D:  09/25/2004  T:  09/25/2004  Job:  [id number]   cc:   Titus Dubin. Alwyn Ren, M.D. [facility]

## 2011-01-15 NOTE — Op Note (Signed)
NAMENATALE, THOMA             ACCOUNT NO.:  [id number]   MEDICAL RECORD NO.:  [id number]          PATIENT TYPE:  AMB   LOCATION:  ENDO                         FACILITY:  [facility]   PHYSICIAN:  Georgiana Spinner, M.D.    DATE OF BIRTH:  03-Jun-1935   DATE OF PROCEDURE:  DATE OF DISCHARGE:                                 OPERATIVE REPORT   PROCEDURE:  Colonoscopy.   INDICATIONS:  Colon cancer screening.   ANESTHESIA:  Demerol 20 mg, Versed 1 IV.   DESCRIPTION OF PROCEDURE:  With the patient mildly sedated and in the left  lateral decubitus position, a rectal examination was performed which was  unremarkable.  I did not feel any prostate gland tissue.  From this point  the colonoscope was then inserted in the rectum and passed under direct  vision to the neo-cecum, photographed.  From this point,  the colonoscope  was slowly withdrawn, taking circumferential views of the colonic mucosa,  stopping only in the rectum which appeared normal on direct and retroflexed  view.  The endoscope was straightened and withdrawn.  The patient's vital  signs and pulse oximetry remained stable.  The patient tolerated the  procedure well without apparent complications.   FINDINGS:  Unremarkable colonoscopic examination to the neo-cecum.   PLAN:  Consider repeat examination possibly in five years.      GMO/MEDQ  D:  09/25/2004  T:  09/25/2004  Job:  [id number]   cc:   Titus Dubin. Alwyn Ren, M.D. [facility]

## 2012-03-01 ENCOUNTER — Encounter: Payer: Self-pay | Admitting: Internal Medicine

## 2012-03-01 ENCOUNTER — Ambulatory Visit (INDEPENDENT_AMBULATORY_CARE_PROVIDER_SITE_OTHER): Payer: BC Managed Care – PPO | Admitting: Internal Medicine

## 2012-03-01 VITALS — BP 128/70 | HR 59 | Temp 98.4°F | Resp 12 | Ht 67.0 in | Wt 188.2 lb

## 2012-03-01 DIAGNOSIS — Z23 Encounter for immunization: Secondary | ICD-10-CM

## 2012-03-01 DIAGNOSIS — Z0000 Encounter for general adult medical examination without abnormal findings: Secondary | ICD-10-CM

## 2012-03-01 DIAGNOSIS — K219 Gastro-esophageal reflux disease without esophagitis: Secondary | ICD-10-CM

## 2012-03-01 DIAGNOSIS — E785 Hyperlipidemia, unspecified: Secondary | ICD-10-CM

## 2012-03-01 DIAGNOSIS — R9431 Abnormal electrocardiogram [ECG] [EKG]: Secondary | ICD-10-CM

## 2012-03-01 DIAGNOSIS — R7309 Other abnormal glucose: Secondary | ICD-10-CM

## 2012-03-01 DIAGNOSIS — E559 Vitamin D deficiency, unspecified: Secondary | ICD-10-CM

## 2012-03-01 DIAGNOSIS — D649 Anemia, unspecified: Secondary | ICD-10-CM

## 2012-03-01 LAB — BASIC METABOLIC PANEL
BUN: 13 mg/dL (ref 6–23)
GFR: 89.8 mL/min (ref >60.00)
Potassium: 4.2 mEq/L (ref 3.5–5.1)
Sodium: 139 mEq/L (ref 135–145)

## 2012-03-01 LAB — CBC WITH DIFFERENTIAL/PLATELET
Eosinophils Relative: 2.3 % (ref 0.0–5.0)
HCT: 41.4 % (ref 39.0–52.0)
Hemoglobin: 13.6 g/dL (ref 13.0–17.0)
Lymphs Abs: 2.5 10*3/uL (ref 0.7–4.0)
Monocytes Relative: 6.1 % (ref 3.0–12.0)
Platelets: 310 10*3/uL (ref 150.0–400.0)
WBC: 8.6 10*3/uL (ref 4.5–10.5)

## 2012-03-01 LAB — HEMOGLOBIN A1C: Hgb A1c MFr Bld: 6.4 % (ref 4.6–6.5)

## 2012-03-01 LAB — TSH: TSH: 2.04 u[IU]/mL (ref 0.35–5.50)

## 2012-03-01 LAB — HEPATIC FUNCTION PANEL
ALT: 20 U/L (ref 0–53)
Albumin: 3.9 g/dL (ref 3.5–5.2)
Total Protein: 7.3 g/dL (ref 6.0–8.3)

## 2012-03-01 LAB — LDL CHOLESTEROL, DIRECT: Direct LDL: 85.8 mg/dL

## 2012-03-01 LAB — MICROALBUMIN / CREATININE URINE RATIO: Creatinine,U: 106.1 mg/dL

## 2012-03-01 LAB — LIPID PANEL
HDL: 38 mg/dL — ABNORMAL LOW (ref >39.00)
Triglycerides: 315 mg/dL — ABNORMAL HIGH (ref 0.0–149.0)

## 2012-03-01 NOTE — Progress Notes (Signed)
Subjective:    Patient ID: Henry [name], male    DOB: 11-29-1934, 77 y.o.   MRN: [id number]  HPI Medicare Wellness Visit:  The following psychosocial & medical history were reviewed as required by Medicare.   Social history: caffeine: minimal , alcohol:  rarely ,  tobacco use :never  & exercise : no program.   Home & personal  safety / fall risk:no issues, activities of daily living: no limitations , seatbelt use : yes , and smoke alarm employment : yes .  Power of Attorney/Living Will status :in place  Vision ( as recorded per Nurse) & Hearing  evaluation : Ophth exam 3 mos ago; see exam. Orientation :oriented x 3 , memory & recall :good,  math testing: good,and mood & affect : normal . Depression / anxiety: denied Travel history : 2012 [place] , immunization status :? Tetanus;Shingles needed , transfusion history:  no, and preventive health surveillance ( colonoscopies, BMD , etc as per protocol/ [facility]): colonoscopy neg 2006, Dental care:  Seen every 6 mos . Chart reviewed &  Updated. Active issues reviewed & addressed.       Review of Systems HYPERLIPIDEMIA: Chest pain, palpitations- no (see serial  EKGs & discussion)       Dyspnea- no Lightheadedness,Syncope- no    Edema- no Abd pain, bowel changes-no   Muscle aches- no Medications: Compliance-no meds; no specific diet  FASTING HYPERGLYCEMIA, PMH of:  Disease Monitoring: Blood Sugar ranges-no monitor  Polyuria/phagia/dipsia- no       Visual problems-no Medications: Compliance- no              Objective:   Physical Exam Gen.:  well-nourished in appearance. Alert, appropriate and cooperative throughout exam. Head: Normocephalic without obvious abnormalities;  pattern alopecia  Eyes: No corneal or conjunctival inflammation noted. Pupils asymmetric , OD > OS. Extraocular motion intact. Vision grossly norma with lensesl. Ears: External  ear exam reveals no significant lesions or deformities. Canals clear .TMs normal.  Hearing is grossly normal bilaterally. Nose: External nasal exam reveals no deformity or inflammation. Nasal mucosa are pink and moist. No lesions or exudates noted.  Mouth: Oral mucosa and oropharynx reveal no lesions or exudates. Teeth in good repair. Neck: No deformities, masses, or tenderness noted. Range of motion &Thyroid normal Lungs: Normal respiratory effort; chest expands symmetrically. Lungs are clear to auscultation without rales, wheezes, or increased work of breathing. Heart: Normal rate and rhythm. Normal S1 and S2. No gallop, click, or rub. S 4 w/o murmur. Abdomen: Bowel sounds normal; abdomen soft and nontender. No masses, organomegaly . Large ventral hernia noted. Genitalia:Dr Tannebaum                                                                                 Musculoskeletal/extremities: Slight lordosis noted of  the thoracic  spine. No clubbing, cyanosis, edema, or deformity noted. Range of motion  normal .Tone & strength  normal.Joints normal. Nail health  good. Vascular: Carotid, radial artery, dorsalis pedis and  posterior tibial pulses are full and equal. No bruits present. Neurologic: Alert and oriented x3. Deep tendon reflexes symmetrical and normal.          Skin: Intact  without suspicious lesions or rashes.Scalp keratoses Lymph: No cervical, axillary lymphadenopathy present. Psych: Mood and affect are normal. Normally interactive                                                                                         Assessment & Plan:  #1 Medicare Wellness Exam; criteria met ; data entered #2 Problem List reviewed ; Assessment/ Recommendations made #3 His EKG reveals nonspecific ST - T changes; these actually appear improved compared to the prior EKG. He is asymptomatic, but he has significant cardiovascular risk factors for which he has not pursued monitor. 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

## 2012-03-01 NOTE — Patient Instructions (Addendum)
Preventive Health Care: Exercise at least 30-45 minutes a day,  3-4 days a week.  Eat a low-fat diet with lots of fruits and vegetables, up to 7-9 servings per day. Avoid obesity; your goal is waist measurement < 40 inches.Consume less than 40 grams of sugar per day from foods & drinks with High Fructose Corn Sugar as #1,2,3 or # 4 on label. Take the EKG to any emergency room or preop visits. There are nonspecific changes; these have improved since 2011 Please try to go on My Chart within the next 24 hours to allow me to release the results directly to you.

## 2012-03-01 NOTE — Assessment & Plan Note (Signed)
Lateral ST-T changes improved compared to 07/31/10 EKG.  EKG to be taken  any emergency room or preop visits. There are nonspecific changes as noted.

## 2012-03-02 LAB — VITAMIN D 25 HYDROXY (VIT D DEFICIENCY, FRACTURES): Vit D, 25-Hydroxy: 41 ng/mL (ref 30–89)

## 2012-05-02 ENCOUNTER — Telehealth: Payer: Self-pay | Admitting: Internal Medicine

## 2012-05-02 NOTE — Telephone Encounter (Signed)
Spoke with Pt who states that he spoke to insurance about getting shingles shot. Pt indicated that insurance will cover for vaccine in office appt schedule for tomorrow morning.

## 2012-05-02 NOTE — Telephone Encounter (Signed)
Pt states his insurance will cover a gout shot and he would like to come in and have it tomorrow morning. Is that something we do here?

## 2012-05-03 ENCOUNTER — Ambulatory Visit (INDEPENDENT_AMBULATORY_CARE_PROVIDER_SITE_OTHER): Payer: BC Managed Care – PPO

## 2012-05-03 DIAGNOSIS — Z23 Encounter for immunization: Secondary | ICD-10-CM

## 2012-05-03 DIAGNOSIS — Z2911 Encounter for prophylactic immunotherapy for respiratory syncytial virus (RSV): Secondary | ICD-10-CM

## 2012-07-14 ENCOUNTER — Ambulatory Visit (INDEPENDENT_AMBULATORY_CARE_PROVIDER_SITE_OTHER): Payer: BC Managed Care – PPO | Admitting: Internal Medicine

## 2012-07-14 ENCOUNTER — Encounter: Payer: Self-pay | Admitting: Internal Medicine

## 2012-07-14 VITALS — BP 138/90 | HR 106 | Temp 98.3°F | Wt 195.8 lb

## 2012-07-14 DIAGNOSIS — J019 Acute sinusitis, unspecified: Secondary | ICD-10-CM

## 2012-07-14 DIAGNOSIS — J40 Bronchitis, not specified as acute or chronic: Secondary | ICD-10-CM

## 2012-07-14 MED ORDER — AMOXICILLIN 500 MG PO CAPS: 500.0000 mg | ORAL_CAPSULE | Freq: Three times a day (TID) | ORAL | Status: DC

## 2012-07-14 MED ORDER — HYDROCODONE-HOMATROPINE 5-1.5 MG/5ML PO SYRP: 5.0000 mL | ORAL_SOLUTION | Freq: Four times a day (QID) | ORAL | Status: DC | PRN

## 2012-07-14 MED ORDER — FLUTICASONE PROPIONATE 50 MCG/ACT NA SUSP: 1.0000 | Freq: Two times a day (BID) | NASAL | Status: DC | PRN

## 2012-07-14 NOTE — Patient Instructions (Addendum)
Plain Mucinex for thick secretions ;force NON dairy fluids . Use a Neti pot daily as needed for sinus congestion; going from open side to congested side . Nasal cleansing in the shower as discussed. Make sure that all residual soap is removed to prevent irritation. Fluticasone 1 spray in each nostril twice a day as needed. Use the "crossover" technique as discussed.    Avoid decongestants as these may have adverse effects including elevation of blood pressure, palpitations, and prostatic dysfunction.

## 2012-07-14 NOTE — Progress Notes (Signed)
  Subjective:    Patient ID: Henry [name], male    DOB: 05/30/1935, 77 y.o.   MRN: [id number]  HPI Symptoms began 07/07/12 as head congestion for which he has taken phenylephrine, dextromethorphan and acetaminophen over-the-counter preparation. He has had some dental pain as well as yellow-green nasal discharge. He describes ear pressure without discharge. He's had some sputum production    Review of Systems There's been no associated extrinsic symptoms of itchy, watery eyes or sneezing. He is not having shortness of breath or wheezing.  He has had no fever, chills, or sweats.  He has a documented allergy to sulfa     Objective:   Physical Exam General appearance:good health ;well nourished; no acute distress or increased work of breathing is present.  No  lymphadenopathy about the head, neck, or axilla noted.   Eyes: No conjunctival inflammation or lid edema is present.   Ears:  External ear exam shows no significant lesions or deformities.  Otoscopic examination reveals clear canals, tympanic membranes are intact bilaterally without bulging, retraction, inflammation or discharge.  Nose:  External nasal examination shows no deformity or inflammation. Nasal mucosa are pink and moist without lesions or exudates. No septal dislocation or deviation.No obstruction to airflow.   Oral exam: Dental hygiene is good; lips and gums are healthy appearing.There is no oropharyngeal erythema or exudate noted.   Neck:  No deformities,  masses, or tenderness noted.      Heart:  Slight tachycardia with flow murmur; regular rhythm. S1 and S2 normal without gallop,  click, rub or other extra sounds.   Lungs:Chest clear to auscultation; no wheezes, rhonchi,rales ,or rubs present.No increased work of breathing.    Extremities:  No cyanosis, edema, or clubbing  noted    Skin: Warm & dry w/o jaundice or tenting.          Assessment & Plan:  #1 rhinosinusitis with possible bronchitis  #2  mildly elevated blood pressure, probably due to the decongestant  Plan: Nasal hygiene interventions discussed. See prescription medications

## 2012-08-07 ENCOUNTER — Other Ambulatory Visit: Payer: Self-pay | Admitting: Gastroenterology

## 2012-12-28 ENCOUNTER — Telehealth: Payer: Self-pay | Admitting: Internal Medicine

## 2012-12-28 MED ORDER — ESOMEPRAZOLE MAGNESIUM 40 MG PO CPDR: 40.0000 mg | DELAYED_RELEASE_CAPSULE | Freq: Every day | ORAL | Status: DC

## 2012-12-28 NOTE — Telephone Encounter (Signed)
Patient called requesting rx for nexium 40 mg. He states he used to take this, but hasn't in a long time. He would like to restart this med. Pt uses [facility] on [name].

## 2012-12-28 NOTE — Telephone Encounter (Signed)
Last OV 06/2012, Hopp please advise if ok to add medication back to med list, patient off Nexium for a couple of years

## 2012-12-28 NOTE — Telephone Encounter (Signed)
Ok #30, R X 2

## 2012-12-28 NOTE — Telephone Encounter (Signed)
RX sent

## 2014-06-10 ENCOUNTER — Other Ambulatory Visit (INDEPENDENT_AMBULATORY_CARE_PROVIDER_SITE_OTHER): Payer: Medicare Other

## 2014-06-10 ENCOUNTER — Encounter: Payer: Self-pay | Admitting: Internal Medicine

## 2014-06-10 ENCOUNTER — Ambulatory Visit (INDEPENDENT_AMBULATORY_CARE_PROVIDER_SITE_OTHER): Payer: Medicare Other | Admitting: Internal Medicine

## 2014-06-10 VITALS — BP 130/90 | HR 61 | Temp 98.4°F | Resp 14 | Ht 66.0 in | Wt 200.5 lb

## 2014-06-10 DIAGNOSIS — R1110 Vomiting, unspecified: Secondary | ICD-10-CM

## 2014-06-10 DIAGNOSIS — R7303 Prediabetes: Secondary | ICD-10-CM

## 2014-06-10 DIAGNOSIS — E785 Hyperlipidemia, unspecified: Secondary | ICD-10-CM

## 2014-06-10 DIAGNOSIS — R7309 Other abnormal glucose: Secondary | ICD-10-CM

## 2014-06-10 DIAGNOSIS — E781 Pure hyperglyceridemia: Secondary | ICD-10-CM

## 2014-06-10 DIAGNOSIS — K2100 Gastro-esophageal reflux disease with esophagitis, without bleeding: Secondary | ICD-10-CM

## 2014-06-10 DIAGNOSIS — E559 Vitamin D deficiency, unspecified: Secondary | ICD-10-CM

## 2014-06-10 DIAGNOSIS — Z8551 Personal history of malignant neoplasm of bladder: Secondary | ICD-10-CM | POA: Insufficient documentation

## 2014-06-10 DIAGNOSIS — Z23 Encounter for immunization: Secondary | ICD-10-CM

## 2014-06-10 DIAGNOSIS — Z0000 Encounter for general adult medical examination without abnormal findings: Secondary | ICD-10-CM

## 2014-06-10 LAB — CBC WITH DIFFERENTIAL/PLATELET
BASOS PCT: 0.4 % (ref 0.0–3.0)
Basophils Absolute: 0 10*3/uL (ref 0.0–0.1)
EOS ABS: 0.2 10*3/uL (ref 0.0–0.7)
Eosinophils Relative: 2.3 % (ref 0.0–5.0)
HCT: 41.9 % (ref 39.0–52.0)
Hemoglobin: 13.7 g/dL (ref 13.0–17.0)
LYMPHS PCT: 29.3 % (ref 12.0–46.0)
Lymphs Abs: 2.8 10*3/uL (ref 0.7–4.0)
MCHC: 32.7 g/dL (ref 30.0–36.0)
MCV: 84.8 fl (ref 78.0–100.0)
MONOS PCT: 5.9 % (ref 3.0–12.0)
Monocytes Absolute: 0.6 10*3/uL (ref 0.1–1.0)
NEUTROS PCT: 62.1 % (ref 43.0–77.0)
Neutro Abs: 6 10*3/uL (ref 1.4–7.7)
Platelets: 324 10*3/uL (ref 150.0–400.0)
RBC: 4.94 Mil/uL (ref 4.22–5.81)
RDW: 15.4 % (ref 11.5–15.5)
WBC: 9.6 10*3/uL (ref 4.0–10.5)

## 2014-06-10 LAB — HEPATIC FUNCTION PANEL
ALK PHOS: 95 U/L (ref 39–117)
ALT: 23 U/L (ref 0–53)
AST: 26 U/L (ref 0–37)
Albumin: 3.6 g/dL (ref 3.5–5.2)
BILIRUBIN DIRECT: 0.1 mg/dL (ref 0.0–0.3)
Total Bilirubin: 0.6 mg/dL (ref 0.2–1.2)
Total Protein: 7.4 g/dL (ref 6.0–8.3)

## 2014-06-10 LAB — BASIC METABOLIC PANEL
BUN: 10 mg/dL (ref 6–23)
CALCIUM: 9.1 mg/dL (ref 8.4–10.5)
CO2: 29 meq/L (ref 19–32)
CREATININE: 0.9 mg/dL (ref 0.4–1.5)
Chloride: 102 mEq/L (ref 96–112)
GFR: 86.97 mL/min (ref >60.00)
GLUCOSE: 100 mg/dL — AB (ref 70–99)
Potassium: 4.1 mEq/L (ref 3.5–5.1)
Sodium: 141 mEq/L (ref 135–145)

## 2014-06-10 LAB — LIPID PANEL
Cholesterol: 187 mg/dL (ref 0–200)
HDL: 27.7 mg/dL — ABNORMAL LOW (ref >39.00)
NONHDL: 159.3
Total CHOL/HDL Ratio: 7
Triglycerides: 518 mg/dL — ABNORMAL HIGH (ref 0.0–149.0)
VLDL: 103.6 mg/dL — ABNORMAL HIGH (ref 0.0–40.0)

## 2014-06-10 LAB — HEMOGLOBIN A1C: Hgb A1c MFr Bld: 6.8 % — ABNORMAL HIGH (ref 4.6–6.5)

## 2014-06-10 LAB — TSH: TSH: 1.76 u[IU]/mL (ref 0.35–4.50)

## 2014-06-10 LAB — LDL CHOLESTEROL, DIRECT: LDL DIRECT: 71.1 mg/dL

## 2014-06-10 LAB — VITAMIN D 25 HYDROXY (VIT D DEFICIENCY, FRACTURES): VITD: 29.04 ng/mL — AB (ref 30.00–100.00)

## 2014-06-10 MED ORDER — OMEPRAZOLE 20 MG PO CPDR: 20.0000 mg | DELAYED_RELEASE_CAPSULE | Freq: Two times a day (BID) | ORAL | Status: DC

## 2014-06-10 NOTE — Assessment & Plan Note (Signed)
A1c

## 2014-06-10 NOTE — Progress Notes (Signed)
Subjective:    Patient ID: Henry [name], male    DOB: 1935-07-05, 79 y.o.   MRN: [id number]  HPI UHC/Medicare Wellness Visit: Psychosocial and medical history were reviewed as required by Medicare (history related to abuse, antisocial behavior , firearm risk). Social history: Caffeine: 2 colas / week  , Alcohol: rarely , Tobacco MBT:DHRCB Exercise:no Personal safety/fall risk:no Limitations of activities of daily living:no Seatbelt/ smoke alarm use:yes Healthcare Power of Attorney/Living Will status: UTD Ophthalmologic exam status:UTD Hearing evaluation status:not UTD Orientation: Oriented X 3 Memory and recall: good Math & spelling fair Depression/anxiety assessment: no Foreign travel history: 2012 [place] Immunization status for influenza/pneumonia/ shingles /tetanus: Flu today; Prevnar needed Transfusion history:no Preventive health care maintenance status: Colonoscopy UTD Dental care: in active treatment Chart reviewed and updated. Active issues reviewed and addressed as documented below.    Review of Systems    For least 6 months he describes regurgitation of the meal contents 2-3 times per day. He also has an associated cough productive of voluminous material,especially @ night. He has taken Pepcid as needed without significant response.  He also describes loose to watery stools for which he's taken Immodium AD as needed.  He denies abdominal pain, unexplained weight loss, dysphagia, melena, or rectal bleeding. Stools can vary in caliber.  He has gained 10 pounds since retirement. He is not physically active. He is on no specific dietary program.  He does have mixed hyperlipidemia; he is not on a heart healthy program.       Objective:   Physical Exam Gen.: Healthy and well-nourished in appearance. Alert, appropriate and cooperative throughout exam. Appears younger than stated age  Head: Normocephalic without obvious abnormalities;  pattern alopecia  Eyes: No  corneal or conjunctival inflammation noted. Pupils unequal . Extraocular motion intact. Ears: External  ear exam reveals no significant lesions or deformities. Canals clear .TMs normal. Hearing is grossly normal bilaterally. Nose: External nasal exam reveals no deformity or inflammation. Nasal mucosa are pink and moist. No lesions or exudates noted.   Mouth: Oral mucosa and oropharynx reveal no lesions or exudates. Teeth in good repair. Neck: No deformities, masses, or tenderness noted. Range of motion & Thyroid normal Lungs: Normal respiratory effort; chest expands symmetrically. Lungs are clear to auscultation without rales, wheezes, or increased work of breathing. Heart: Normal rate and rhythm. Normal S1 and S2. No gallop, click, or rub. No murmur. Abdomen: Protuberant.Bowel sounds normal; abdomen soft and nontender. No masses, organomegaly or hernias noted. Genitalia: as per Dr Hartley Barefoot                             Musculoskeletal/extremities: No deformity or scoliosis noted of  the thoracic or lumbar spine.  No clubbing, cyanosis, edema, or significant extremity  deformity noted. Range of motion normal .Tone & strength normal. Hand joints normal  Fingernail health good. Able to lie down & sit up w/o help. Negative SLR bilaterally Vascular: Carotid, radial artery, dorsalis pedis and  posterior tibial pulses are full and equal. No bruits present. Neurologic: Alert and oriented x3. Deep tendon reflexes symmetrical and normal.  Gait normal .       Skin: Intact without suspicious lesions or rashes. Lymph: No cervical, axillary lymphadenopathy present. Psych: Mood and affect are normal. Normally interactive  Assessment & Plan:  See Current Assessment & Plan in Problem List under specific DiagnosisThe labs will be reviewed and risks and options assessed. Written recommendations will be provided by mail or  directly through My Chart.Further evaluation or change in medical therapy will be directed by those results. Frequent meal regurgitation; R/O stricture

## 2014-06-10 NOTE — Assessment & Plan Note (Signed)
Lipids, LFTs, TSH ,CK 

## 2014-06-10 NOTE — Patient Instructions (Signed)
Reflux of gastric acid may be asymptomatic as this may occur mainly during sleep.The triggers for reflux  include stress; the "aspirin family" ; alcohol; peppermint; and caffeine (coffee, tea, cola, and chocolate). The aspirin family would include aspirin and the nonsteroidal agents such as ibuprofen &  Naproxen. Tylenol would not cause reflux. If having symptoms ; food & drink should be avoided for @ least 2 hours before going to bed. Take the protein pump inhibitor 30 minutes before breakfast and 30 minutes before the evening meal for 8 weeks then go back to once a day  30 minutes before breakfast.  Your next office appointment will be determined based upon review of your pending labs . Those instructions will be transmitted to you by mail.Followup as needed for your acute issue. Please report any significant change in your symptoms.

## 2014-06-10 NOTE — Assessment & Plan Note (Signed)
CBC PPI bid  Confirm GI

## 2014-06-10 NOTE — Progress Notes (Signed)
Pre visit review using our clinic review tool, if applicable. No additional management support is needed unless otherwise documented below in the visit note. 

## 2014-06-10 NOTE — Assessment & Plan Note (Signed)
Vitamin D level 

## 2014-06-11 ENCOUNTER — Other Ambulatory Visit: Payer: Self-pay | Admitting: Internal Medicine

## 2014-06-11 DIAGNOSIS — E1165 Type 2 diabetes mellitus with hyperglycemia: Secondary | ICD-10-CM

## 2014-06-11 DIAGNOSIS — E781 Pure hyperglyceridemia: Secondary | ICD-10-CM

## 2014-06-11 DIAGNOSIS — IMO0002 Reserved for concepts with insufficient information to code with codable children: Secondary | ICD-10-CM

## 2014-06-11 DIAGNOSIS — E559 Vitamin D deficiency, unspecified: Secondary | ICD-10-CM

## 2014-08-15 ENCOUNTER — Other Ambulatory Visit: Payer: Self-pay | Admitting: Gastroenterology

## 2014-09-06 ENCOUNTER — Encounter: Payer: Self-pay | Admitting: Internal Medicine

## 2014-09-06 ENCOUNTER — Other Ambulatory Visit (INDEPENDENT_AMBULATORY_CARE_PROVIDER_SITE_OTHER): Payer: Medicare Other

## 2014-09-06 ENCOUNTER — Ambulatory Visit (INDEPENDENT_AMBULATORY_CARE_PROVIDER_SITE_OTHER): Payer: Medicare Other | Admitting: Internal Medicine

## 2014-09-06 VITALS — BP 140/86 | HR 85 | Temp 98.2°F | Resp 16 | Ht 66.0 in | Wt 195.5 lb

## 2014-09-06 DIAGNOSIS — Z23 Encounter for immunization: Secondary | ICD-10-CM

## 2014-09-06 DIAGNOSIS — E1165 Type 2 diabetes mellitus with hyperglycemia: Secondary | ICD-10-CM | POA: Diagnosis not present

## 2014-09-06 DIAGNOSIS — K2100 Gastro-esophageal reflux disease with esophagitis, without bleeding: Secondary | ICD-10-CM

## 2014-09-06 DIAGNOSIS — E781 Pure hyperglyceridemia: Secondary | ICD-10-CM

## 2014-09-06 DIAGNOSIS — D485 Neoplasm of uncertain behavior of skin: Secondary | ICD-10-CM

## 2014-09-06 DIAGNOSIS — IMO0002 Reserved for concepts with insufficient information to code with codable children: Secondary | ICD-10-CM

## 2014-09-06 DIAGNOSIS — D492 Neoplasm of unspecified behavior of bone, soft tissue, and skin: Secondary | ICD-10-CM

## 2014-09-06 LAB — HEPATIC FUNCTION PANEL
ALT: 31 U/L (ref 0–53)
AST: 25 U/L (ref 0–37)
Albumin: 4.1 g/dL (ref 3.5–5.2)
Alkaline Phosphatase: 111 U/L (ref 39–117)
BILIRUBIN DIRECT: 0.1 mg/dL (ref 0.0–0.3)
BILIRUBIN TOTAL: 0.7 mg/dL (ref 0.2–1.2)
TOTAL PROTEIN: 7.2 g/dL (ref 6.0–8.3)

## 2014-09-06 LAB — MICROALBUMIN / CREATININE URINE RATIO
Creatinine,U: 111.7 mg/dL
Microalb Creat Ratio: 1.4 mg/g (ref 0.0–30.0)
Microalb, Ur: 1.6 mg/dL (ref 0.0–1.9)

## 2014-09-06 LAB — BASIC METABOLIC PANEL
BUN: 14 mg/dL (ref 6–23)
CALCIUM: 9.2 mg/dL (ref 8.4–10.5)
CHLORIDE: 104 meq/L (ref 96–112)
CO2: 30 meq/L (ref 19–32)
CREATININE: 0.9 mg/dL (ref 0.4–1.5)
GFR: 85.81 mL/min (ref >60.00)
Glucose, Bld: 117 mg/dL — ABNORMAL HIGH (ref 70–99)
Potassium: 4.5 mEq/L (ref 3.5–5.1)
Sodium: 141 mEq/L (ref 135–145)

## 2014-09-06 LAB — LIPID PANEL
Cholesterol: 182 mg/dL (ref 0–200)
HDL: 28.1 mg/dL — AB (ref >39.00)
NonHDL: 153.9
Total CHOL/HDL Ratio: 6
Triglycerides: 451 mg/dL — ABNORMAL HIGH (ref 0.0–149.0)
VLDL: 90.2 mg/dL — ABNORMAL HIGH (ref 0.0–40.0)

## 2014-09-06 LAB — HEMOGLOBIN A1C: Hgb A1c MFr Bld: 6.8 % — ABNORMAL HIGH (ref 4.6–6.5)

## 2014-09-06 LAB — LDL CHOLESTEROL, DIRECT: LDL DIRECT: 70.5 mg/dL

## 2014-09-06 NOTE — Assessment & Plan Note (Signed)
A1c , urine microalbumin, BMET See AVS

## 2014-09-06 NOTE — Progress Notes (Signed)
Pre visit review using our clinic review tool, if applicable. No additional management support is needed unless otherwise documented below in the visit note. 

## 2014-09-06 NOTE — Assessment & Plan Note (Signed)
Lipids, LFTs 

## 2014-09-06 NOTE — Assessment & Plan Note (Signed)
The triggers for reflux  include stress; the "aspirin family" ; alcohol; peppermint; and caffeine (coffee, tea, cola, and chocolate). The aspirin family would include aspirin and the nonsteroidal agents such as ibuprofen &  Naproxen. Tylenol would not cause reflux. If having symptoms ; food & drink should be avoided for @ least 2 hours before going to bed.

## 2014-09-06 NOTE — Patient Instructions (Addendum)
Your next office appointment will be determined based upon review of your pending labs &/ or x-rays. Those instructions will be transmitted to you by mail   Reflux of gastric acid may be asymptomatic as this may occur mainly during sleep.The triggers for reflux  include stress; the "aspirin family" ; alcohol; peppermint; and caffeine (coffee, tea, cola, and chocolate). The aspirin family would include aspirin and the nonsteroidal agents such as ibuprofen &  Naproxen. Tylenol would not cause reflux. If having symptoms ; food & drink should be avoided for @ least 2 hours before going to bed.   The most common cause of elevated triglycerides (TG) is the ingestion of sugar from high fructose corn syrup sources added to processed foods & drinks.  Eat a low-fat diet with lots of fruits and vegetables, up to 7-9 servings per day. Consume less than 40 Grams (preferably ZERO) of sugar per day from foods & drinks with High Fructose Corn Syrup (HFCS) sugar as #1,2,3 or # 4 on label.Whole [employer], [employer] do not carry products with HFCS.  The following nutritional changes may help prevent Diabetes progression & complications.  White carbohydrates (potatoes, rice, bread, and pasta) cause a high spike of the sugar level which stays elevated for a significant period of time (called sugar"load").  For example a  baked potato has a cup of sugar and a  french fry  2 teaspoons of sugar.  More complex carbs such as yams, wild  rice, whole grained bread &  wheat pasta have been much lower spike and persistent load of sugar than the white carbs. The pancreas excretes excess insulin in response to the high spike & load of sugar . Over time the pancreas can actually run out of insulin necessitating insulin shots.  The Dermatology referral will be scheduled and you'll be notified of the time.Please call the Referral Co-Ordinator @ [phone] if you have not been notified of appointment time within 7-10 days.

## 2014-09-06 NOTE — Progress Notes (Signed)
   Subjective:    Patient ID: Henry [name], male    DOB: [birth year], 80 y.o.   MRN: [id number]  HPI  He underwent endoscopy 08/15/14 by Dr. Paulita Fujita. He was found to have marked chronic inflammation at the esophageal gastric junction. The biopsy report was provided to him and discussed and the significance of it long-term reviewed.  He is now on a PPI with 80% improvement in symptoms  He is monitoring his blood pressure at home. Range is 135-155/70-85. Average is 140/70.  He states he has tried to improve his diet, particularly in reference to intake of High Fructose Corn Syrup sugar.  On 06/10/14 his lipid panel revealed triglycerides of 518 and HDL of 27.7. At that time his  A1c was 6.8%   Review of Systems   Chest pain, palpitations, tachycardia, exertional dyspnea, paroxysmal nocturnal dyspnea, claudication or edema are absent.  Unexplained weight loss, abdominal pain, significant dyspepsia, dysphagia, melena, rectal bleeding, or persistently small caliber stools are denied.  Polyuria, polyphagia, polydipsia absent. There is no blurred vision, double vision, or loss of vision.  Also denied are numbness, tingling, or burning of the extremities. No nonhealing skin lesions present. Weight is stable.     Objective:   Physical Exam  Pertinent or positive findings include: Pattern alopecia is present He has a 5 x 7 mm warty growth on the left jaw. He feels this is enlarging. His heart is slow and slightly irregular intermittently. Abdomen is protuberant.  General appearance :adequately nourished; in no distress. Eyes: No conjunctival inflammation or scleral icterus is present. Oral exam: Dental hygiene is good. Lips and gums are healthy appearing.There is no oropharyngeal erythema or exudate noted.  Heart:  No gallop, murmur, click, rub or other extra sounds  Lungs:Chest clear to auscultation; no wheezes, rhonchi,rales ,or rubs present.No increased work of breathing.  Abdomen:  bowel sounds normal, soft and non-tender without masses, organomegaly or hernias noted.  No guarding or rebound.  Vascular : all pulses equal ; no bruits present. Skin:Warm & dry. No jaundice or tenting Lymphatic: No lymphadenopathy is noted about the head, neck, axilla          Assessment & Plan:  #1See Current Assessment & Plan in Problem List under specific Diagnosis #2 neoplasm jaw of uncertain etiology Derm referral

## 2014-09-08 ENCOUNTER — Other Ambulatory Visit: Payer: Self-pay | Admitting: Internal Medicine

## 2014-09-08 DIAGNOSIS — E1165 Type 2 diabetes mellitus with hyperglycemia: Secondary | ICD-10-CM

## 2014-09-08 DIAGNOSIS — E785 Hyperlipidemia, unspecified: Secondary | ICD-10-CM

## 2014-09-08 DIAGNOSIS — IMO0002 Reserved for concepts with insufficient information to code with codable children: Secondary | ICD-10-CM

## 2014-09-10 DIAGNOSIS — D2239 Melanocytic nevi of other parts of face: Secondary | ICD-10-CM | POA: Diagnosis not present

## 2014-09-10 DIAGNOSIS — L821 Other seborrheic keratosis: Secondary | ICD-10-CM | POA: Diagnosis not present

## 2014-09-10 DIAGNOSIS — L820 Inflamed seborrheic keratosis: Secondary | ICD-10-CM | POA: Diagnosis not present

## 2014-09-11 ENCOUNTER — Telehealth: Payer: Self-pay | Admitting: Internal Medicine

## 2014-09-11 NOTE — Telephone Encounter (Signed)
Patient has been notified via voicemail of Dr Hopper's response.

## 2014-09-11 NOTE — Telephone Encounter (Signed)
Whole issue several weeks ago was on individuals who had dramatic weight loss ;it describes their techniques

## 2014-09-11 NOTE — Telephone Encounter (Signed)
Patient states he was told to read an article on health in [employer].  He would like to know what date or issue this was in.

## 2015-01-28 DIAGNOSIS — Z86010 Personal history of colonic polyps: Secondary | ICD-10-CM | POA: Diagnosis not present

## 2015-01-28 DIAGNOSIS — R1310 Dysphagia, unspecified: Secondary | ICD-10-CM | POA: Diagnosis not present

## 2015-01-28 DIAGNOSIS — K219 Gastro-esophageal reflux disease without esophagitis: Secondary | ICD-10-CM | POA: Diagnosis not present

## 2015-02-03 DIAGNOSIS — H40012 Open angle with borderline findings, low risk, left eye: Secondary | ICD-10-CM | POA: Diagnosis not present

## 2015-02-03 DIAGNOSIS — Z9841 Cataract extraction status, right eye: Secondary | ICD-10-CM | POA: Diagnosis not present

## 2015-02-03 LAB — HM DIABETES EYE EXAM

## 2015-02-12 ENCOUNTER — Encounter (INDEPENDENT_AMBULATORY_CARE_PROVIDER_SITE_OTHER): Payer: Medicare Other | Admitting: Ophthalmology

## 2015-02-12 DIAGNOSIS — E11319 Type 2 diabetes mellitus with unspecified diabetic retinopathy without macular edema: Secondary | ICD-10-CM

## 2015-02-12 DIAGNOSIS — H2512 Age-related nuclear cataract, left eye: Secondary | ICD-10-CM

## 2015-02-12 DIAGNOSIS — E11329 Type 2 diabetes mellitus with mild nonproliferative diabetic retinopathy without macular edema: Secondary | ICD-10-CM | POA: Diagnosis not present

## 2015-02-12 DIAGNOSIS — H43813 Vitreous degeneration, bilateral: Secondary | ICD-10-CM | POA: Diagnosis not present

## 2015-02-18 ENCOUNTER — Encounter: Payer: Self-pay | Admitting: Internal Medicine

## 2015-03-10 ENCOUNTER — Other Ambulatory Visit: Payer: Self-pay | Admitting: Optometry

## 2015-03-10 DIAGNOSIS — D2212 Melanocytic nevi of left eyelid, including canthus: Secondary | ICD-10-CM | POA: Diagnosis not present

## 2015-03-10 DIAGNOSIS — D2312 Other benign neoplasm of skin of left eyelid, including canthus: Secondary | ICD-10-CM | POA: Diagnosis not present

## 2015-03-10 DIAGNOSIS — H02825 Cysts of left lower eyelid: Secondary | ICD-10-CM | POA: Diagnosis not present

## 2015-04-04 ENCOUNTER — Ambulatory Visit (INDEPENDENT_AMBULATORY_CARE_PROVIDER_SITE_OTHER)
Admission: RE | Admit: 2015-04-04 | Discharge: 2015-04-04 | Disposition: A | Discharge status: Home / Self Care | Payer: Medicare Other | Source: Ambulatory Visit | Attending: Internal Medicine | Admitting: Internal Medicine

## 2015-04-04 ENCOUNTER — Encounter: Payer: Self-pay | Admitting: Internal Medicine

## 2015-04-04 ENCOUNTER — Ambulatory Visit (INDEPENDENT_AMBULATORY_CARE_PROVIDER_SITE_OTHER): Payer: Medicare Other | Admitting: Internal Medicine

## 2015-04-04 VITALS — BP 146/86 | HR 71 | Temp 98.3°F | Resp 16 | Wt 199.0 lb

## 2015-04-04 DIAGNOSIS — R079 Chest pain, unspecified: Secondary | ICD-10-CM | POA: Diagnosis not present

## 2015-04-04 DIAGNOSIS — R0789 Other chest pain: Secondary | ICD-10-CM | POA: Diagnosis not present

## 2015-04-04 NOTE — Progress Notes (Signed)
Pre visit review using our clinic review tool, if applicable. No additional management support is needed unless otherwise documented below in the visit note. 
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
## 2015-04-04 NOTE — Patient Instructions (Signed)
Consider glucosamine sulfate 1500 mg daily for cartilage pain. Take this daily  for 4 weeks. This will rehydrate the cartilages.Use an anti-inflammatory cream such as Aspercreme or Zostrix cream twice a day to the affected area as needed. In lieu of this warm moist compresses or  hot water bottle can be used. Do not apply ice .

## 2015-07-31 DIAGNOSIS — Z86010 Personal history of colonic polyps: Secondary | ICD-10-CM | POA: Diagnosis not present

## 2015-07-31 DIAGNOSIS — K219 Gastro-esophageal reflux disease without esophagitis: Secondary | ICD-10-CM | POA: Diagnosis not present

## 2015-08-21 ENCOUNTER — Telehealth: Payer: Self-pay

## 2015-08-21 NOTE — Telephone Encounter (Signed)
Patient declined flu vaccine, also advised patient to get established with new PCP soon, dr hopper retiring 08/29/15

## 2015-11-07 ENCOUNTER — Encounter: Payer: Self-pay | Admitting: Internal Medicine

## 2015-11-07 ENCOUNTER — Ambulatory Visit (INDEPENDENT_AMBULATORY_CARE_PROVIDER_SITE_OTHER): Payer: Medicare Other | Admitting: Internal Medicine

## 2015-11-07 ENCOUNTER — Other Ambulatory Visit (INDEPENDENT_AMBULATORY_CARE_PROVIDER_SITE_OTHER): Payer: Medicare Other

## 2015-11-07 VITALS — BP 130/80 | HR 51 | Temp 98.5°F | Resp 16 | Ht 66.0 in | Wt 187.0 lb

## 2015-11-07 DIAGNOSIS — E119 Type 2 diabetes mellitus without complications: Secondary | ICD-10-CM

## 2015-11-07 DIAGNOSIS — E785 Hyperlipidemia, unspecified: Secondary | ICD-10-CM | POA: Diagnosis not present

## 2015-11-07 DIAGNOSIS — D649 Anemia, unspecified: Secondary | ICD-10-CM | POA: Diagnosis not present

## 2015-11-07 DIAGNOSIS — Z23 Encounter for immunization: Secondary | ICD-10-CM

## 2015-11-07 DIAGNOSIS — Z0000 Encounter for general adult medical examination without abnormal findings: Secondary | ICD-10-CM | POA: Insufficient documentation

## 2015-11-07 LAB — CBC
HCT: 39.5 % (ref 39.0–52.0)
Hemoglobin: 13.1 g/dL (ref 13.0–17.0)
MCHC: 33.2 g/dL (ref 30.0–36.0)
MCV: 84.8 fl (ref 78.0–100.0)
Platelets: 308 10*3/uL (ref 150.0–400.0)
RBC: 4.66 Mil/uL (ref 4.22–5.81)
RDW: 15 % (ref 11.5–15.5)
WBC: 9.4 10*3/uL (ref 4.0–10.5)

## 2015-11-07 LAB — COMPREHENSIVE METABOLIC PANEL
ALBUMIN: 4.1 g/dL (ref 3.5–5.2)
ALT: 12 U/L (ref 0–53)
AST: 15 U/L (ref 0–37)
Alkaline Phosphatase: 79 U/L (ref 39–117)
BILIRUBIN TOTAL: 0.7 mg/dL (ref 0.2–1.2)
BUN: 15 mg/dL (ref 6–23)
CALCIUM: 9.3 mg/dL (ref 8.4–10.5)
CO2: 30 meq/L (ref 19–32)
Chloride: 106 mEq/L (ref 96–112)
Creatinine, Ser: 1.06 mg/dL (ref 0.40–1.50)
GFR: 71.74 mL/min (ref >60.00)
GLUCOSE: 120 mg/dL — AB (ref 70–99)
Potassium: 5.2 mEq/L — ABNORMAL HIGH (ref 3.5–5.1)
Sodium: 142 mEq/L (ref 135–145)
TOTAL PROTEIN: 6.9 g/dL (ref 6.0–8.3)

## 2015-11-07 LAB — LIPID PANEL
CHOL/HDL RATIO: 4
Cholesterol: 158 mg/dL (ref 0–200)
HDL: 41.2 mg/dL (ref >39.00)
LDL CALC: 81 mg/dL (ref 0–99)
NONHDL: 116.57
TRIGLYCERIDES: 178 mg/dL — AB (ref 0.0–149.0)
VLDL: 35.6 mg/dL (ref 0.0–40.0)

## 2015-11-07 LAB — HEMOGLOBIN A1C: Hgb A1c MFr Bld: 6.3 % (ref 4.6–6.5)

## 2015-11-07 NOTE — Progress Notes (Signed)
Pre visit review using our clinic review tool, if applicable. No additional management support is needed unless otherwise documented below in the visit note. 

## 2015-11-07 NOTE — Progress Notes (Signed)
   Subjective:    Patient ID: Henry [name], male    DOB: 06/27/1935, 81 y.o.   MRN: [id number]  HPI Here for medicare wellness, no new complaints. Please see A/P for status and treatment of chronic medical problems.   Diet: heart healthy Physical activity: active, bikes 100 miles per week Depression/mood screen: negative Hearing: intact to whispered voice Visual acuity: grossly normal with lens, performs annual eye exam  ADLs: capable Fall risk: none Home safety: good Cognitive evaluation: intact to orientation, naming, recall and repetition EOL planning: adv directives discussed, in place  I have personally reviewed and have noted 1. The patient's medical and social history - reviewed today no changes 2. Their use of alcohol, tobacco or illicit drugs 3. Their current medications and supplements 4. The patient's functional ability including ADL's, fall risks, home safety risks and hearing or visual impairment. 5. Diet and physical activities 6. Evidence for depression or mood disorders 7. Care team reviewed and updated (available in snapshot)  Review of Systems  Constitutional: Negative for fever, activity change, appetite change and fatigue.  HENT: Negative.   Eyes: Negative.   Respiratory: Negative for cough, chest tightness, shortness of breath and wheezing.   Cardiovascular: Negative for chest pain, palpitations and leg swelling.  Gastrointestinal: Negative for nausea, abdominal pain, diarrhea, constipation and abdominal distention.  Musculoskeletal: Negative.   Skin: Negative.   Neurological: Negative.   Psychiatric/Behavioral: Negative.       Objective:   Physical Exam  Constitutional: He is oriented to person, place, and time. He appears well-developed and well-nourished.  Overweight  HENT:  Head: Normocephalic and atraumatic.  Eyes: EOM are normal.  Neck: Normal range of motion.  Cardiovascular: Normal rate and regular rhythm.   Pulmonary/Chest: Effort  normal and breath sounds normal. No respiratory distress. He has no wheezes. He has no rales.  Abdominal: Soft. Bowel sounds are normal. He exhibits no distension. There is no tenderness. There is no rebound.  Musculoskeletal: He exhibits no edema.  Neurological: He is alert and oriented to person, place, and time. Coordination normal.  Skin: Skin is warm and dry.  Psychiatric: He has a normal mood and affect.   Filed Vitals:   11/07/15 0934  BP: 130/80  Pulse: 51  Temp: 98.5 F (36.9 C)  TempSrc: Oral  Resp: 16  Height: '5\' 6"'$  (1.676 m)  Weight: 187 lb (84.823 kg)  SpO2: 98%      Assessment & Plan:  Tdap given at visit

## 2015-11-07 NOTE — Assessment & Plan Note (Signed)
No follow up recently and unclear if this is a current problem. Checking CBC today.

## 2015-11-07 NOTE — Assessment & Plan Note (Signed)
Foot exam done today, checking HgA1c, cycles 100 miles per week. Diet is fair for his diabetes.

## 2015-11-07 NOTE — Assessment & Plan Note (Signed)
Checking labs, referral for dermatology for some changing moles. Counseled about sun safety and mole surveillance. Given tdap to update immunizations. Colonoscopy up to date. Given 10 year screening recommendations.

## 2015-11-07 NOTE — Patient Instructions (Signed)
We have given you the tetanus shot today and will check the labs.   Keep up the good work with the health.   Come back in about 6-12 months for a check up and please feel free to call us sooner if needed.   Health Maintenance, Male A healthy lifestyle and preventative care can promote health and wellness.  Maintain regular health, dental, and eye exams.  Eat a healthy diet. Foods like vegetables, fruits, whole grains, low-fat dairy products, and lean protein foods contain the nutrients you need and are low in calories. Decrease your intake of foods high in solid fats, added sugars, and salt. Get information about a proper diet from your health care provider, if necessary.  Regular physical exercise is one of the most important things you can do for your health. Most adults should get at least 150 minutes of moderate-intensity exercise (any activity that increases your heart rate and causes you to sweat) each week. In addition, most adults need muscle-strengthening exercises on 2 or more days a week.   Maintain a healthy weight. The body mass index (BMI) is a screening tool to identify possible weight problems. It provides an estimate of body fat based on height and weight. Your health care provider can find your BMI and can help you achieve or maintain a healthy weight. For males 20 years and older:  A BMI below 18.5 is considered underweight.  A BMI of 18.5 to 24.9 is normal.  A BMI of 25 to 29.9 is considered overweight.  A BMI of 30 and above is considered obese.  Maintain normal blood lipids and cholesterol by exercising and minimizing your intake of saturated fat. Eat a balanced diet with plenty of fruits and vegetables. Blood tests for lipids and cholesterol should begin at age 52 and be repeated every 5 years. If your lipid or cholesterol levels are high, you are over age 28, or you are at high risk for heart disease, you may need your cholesterol levels checked more  frequently.Ongoing high lipid and cholesterol levels should be treated with medicines if diet and exercise are not working.  If you smoke, find out from your health care provider how to quit. If you do not use tobacco, do not start.  Lung cancer screening is recommended for adults aged 67-80 years who are at high risk for developing lung cancer because of a history of smoking. A yearly low-dose CT scan of the lungs is recommended for people who have at least a 30-pack-year history of smoking and are current smokers or have quit within the past 15 years. A pack year of smoking is smoking an average of 1 pack of cigarettes a day for 1 year (for example, a 30-pack-year history of smoking could mean smoking 1 pack a day for 30 years or 2 packs a day for 15 years). Yearly screening should continue until the smoker has stopped smoking for at least 15 years. Yearly screening should be stopped for people who develop a health problem that would prevent them from having lung cancer treatment.  If you choose to drink alcohol, do not have more than 2 drinks per day. One drink is considered to be 12 oz (360 mL) of beer, 5 oz (150 mL) of wine, or 1.5 oz (45 mL) of liquor.  Avoid the use of street drugs. Do not share needles with anyone. Ask for help if you need support or instructions about stopping the use of drugs.  High blood pressure causes  heart disease and increases the risk of stroke. High blood pressure is more likely to develop in:  People who have blood pressure in the end of the normal range (100-139/85-89 mm Hg).  People who are overweight or obese.  People who are African American.  If you are 25-29 years of age, have your blood pressure checked every 3-5 years. If you are 4 years of age or older, have your blood pressure checked every year. You should have your blood pressure measured twice--once when you are at a hospital or clinic, and once when you are not at a hospital or clinic. Record the  average of the two measurements. To check your blood pressure when you are not at a hospital or clinic, you can use:  An automated blood pressure machine at a pharmacy.  A home blood pressure monitor.  If you are 69-38 years old, ask your health care provider if you should take aspirin to prevent heart disease.  Diabetes screening involves taking a blood sample to check your fasting blood sugar level. This should be done once every 3 years after age 81 if you are at a normal weight and without risk factors for diabetes. Testing should be considered at a younger age or be carried out more frequently if you are overweight and have at least 1 risk factor for diabetes.  Colorectal cancer can be detected and often prevented. Most routine colorectal cancer screening begins at the age of 63 and continues through age 71. However, your health care provider may recommend screening at an earlier age if you have risk factors for colon cancer. On a yearly basis, your health care provider may provide home test kits to check for hidden blood in the stool. A small camera at the end of a tube may be used to directly examine the colon (sigmoidoscopy or colonoscopy) to detect the earliest forms of colorectal cancer. Talk to your health care provider about this at age 69 when routine screening begins. A direct exam of the colon should be repeated every 5-10 years through age 31, unless early forms of precancerous polyps or small growths are found.  People who are at an increased risk for hepatitis B should be screened for this virus. You are considered at high risk for hepatitis B if:  You were born in a country where hepatitis B occurs often. Talk with your health care provider about which countries are considered high risk.  Your parents were born in a high-risk country and you have not received a shot to protect against hepatitis B (hepatitis B vaccine).  You have HIV or AIDS.  You use needles to inject street  drugs.  You live with, or have sex with, someone who has hepatitis B.  You are a man who has sex with other men (MSM).  You get hemodialysis treatment.  You take certain medicines for conditions like cancer, organ transplantation, and autoimmune conditions.  Hepatitis C blood testing is recommended for all people born from [birth year] through 1965 and any individual with known risk factors for hepatitis C.  Healthy men should no longer receive prostate-specific antigen (PSA) blood tests as part of routine cancer screening. Talk to your health care provider about prostate cancer screening.  Testicular cancer screening is not recommended for adolescents or adult males who have no symptoms. Screening includes self-exam, a health care provider exam, and other screening tests. Consult with your health care provider about any symptoms you have or any concerns you have about  testicular cancer.  Practice safe sex. Use condoms and avoid high-risk sexual practices to reduce the spread of sexually transmitted infections (STIs).  You should be screened for STIs, including gonorrhea and chlamydia if:  You are sexually active and are younger than 24 years.  You are older than 24 years, and your health care provider tells you that you are at risk for this type of infection.  Your sexual activity has changed since you were last screened, and you are at an increased risk for chlamydia or gonorrhea. Ask your health care provider if you are at risk.  If you are at risk of being infected with HIV, it is recommended that you take a prescription medicine daily to prevent HIV infection. This is called pre-exposure prophylaxis (PrEP). You are considered at risk if:  You are a man who has sex with other men (MSM).  You are a heterosexual man who is sexually active with multiple partners.  You take drugs by injection.  You are sexually active with a partner who has HIV.  Talk with your health care provider about  whether you are at high risk of being infected with HIV. If you choose to begin PrEP, you should first be tested for HIV. You should then be tested every 3 months for as long as you are taking PrEP.  Use sunscreen. Apply sunscreen liberally and repeatedly throughout the day. You should seek shade when your shadow is shorter than you. Protect yourself by wearing long sleeves, pants, a wide-brimmed hat, and sunglasses year round whenever you are outdoors.  Tell your health care provider of new moles or changes in moles, especially if there is a change in shape or color. Also, tell your health care provider if a mole is larger than the size of a pencil eraser.  A one-time screening for abdominal aortic aneurysm (AAA) and surgical repair of large AAAs by ultrasound is recommended for men aged 10-75 years who are current or former smokers.  Stay current with your vaccines (immunizations).   This information is not intended to replace advice given to you by your health care provider. Make sure you discuss any questions you have with your health care provider.   Document Released: 02/12/2008 Document Revised: 09/06/2014 Document Reviewed: 01/11/2011 Elsevier Interactive Patient Education [employer].

## 2015-11-07 NOTE — Assessment & Plan Note (Signed)
Checking lipid panel today and adjust as needed.

## 2015-11-07 NOTE — Addendum Note (Signed)
Addended by: Resa Miner R on: 11/07/2015 04:50 PM   Modules accepted: Orders

## 2016-10-29 ENCOUNTER — Encounter: Payer: Self-pay | Admitting: Internal Medicine

## 2016-10-29 ENCOUNTER — Ambulatory Visit (INDEPENDENT_AMBULATORY_CARE_PROVIDER_SITE_OTHER): Payer: Medicare Other | Admitting: Internal Medicine

## 2016-10-29 DIAGNOSIS — R21 Rash and other nonspecific skin eruption: Secondary | ICD-10-CM | POA: Insufficient documentation

## 2016-10-29 MED ORDER — ZOLPIDEM TARTRATE 5 MG PO TABS: 5.0000 mg | ORAL_TABLET | Freq: Every evening | ORAL | 1 refills | Status: DC | PRN

## 2016-10-29 MED ORDER — FLUCONAZOLE 150 MG PO TABS: 150.0000 mg | ORAL_TABLET | Freq: Once | ORAL | 0 refills | Status: AC

## 2016-10-29 MED ORDER — NYSTATIN-TRIAMCINOLONE 100000-0.1 UNIT/GM-% EX OINT: 1.0000 "application " | TOPICAL_OINTMENT | Freq: Two times a day (BID) | CUTANEOUS | 0 refills | Status: DC

## 2016-10-29 NOTE — Progress Notes (Signed)
Pre visit review using our clinic review tool, if applicable. No additional management support is needed unless otherwise documented below in the visit note. 

## 2016-10-29 NOTE — Assessment & Plan Note (Signed)
Appears to be fungal. Rx for diflucan and mycolog cream. Advised to use corn starch to help with moisture to prevent recurrence.

## 2016-10-29 NOTE — Patient Instructions (Signed)
We have sent in the pill called diflucan for the rash that you take once. We have also sent in a cream to use to help it go away faster.   We have sent in the ambien to the pharmacy for the traveling.

## 2016-10-29 NOTE — Progress Notes (Signed)
   Subjective:    Patient ID: Henry [name], male    DOB: 11-May-1935, 82 y.o.   MRN: [id number]  HPI The patient is a 82 YO man coming in for rash under his armpits. Noticed about 1-2 weeks ago and it has been growing gradually since that time. He denies using deodorant and does motorcycle riding and sweats a good amount. No new clothes or detergent. Some itching but no pain. No fevers or chills.   Review of Systems  Constitutional: Negative.   Respiratory: Negative.   Cardiovascular: Negative.   Gastrointestinal: Negative.   Musculoskeletal: Negative.   Skin: Positive for color change and rash. Negative for pallor and wound.      Objective:   Physical Exam  Constitutional: He is oriented to person, place, and time. He appears well-developed and well-nourished.  HENT:  Head: Normocephalic and atraumatic.  Eyes: EOM are normal.  Neck: Normal range of motion.  Cardiovascular: Normal rate and regular rhythm.   Pulmonary/Chest: Effort normal.  Abdominal: Soft.  Musculoskeletal: He exhibits no edema.  Neurological: He is alert and oriented to person, place, and time.  Skin: Skin is warm and dry. Rash noted.  Rash under both armpits which has some satellite lesions, no pain to palpation   Vitals:   10/29/16 1610  BP: (!) 150/100  Pulse: 90  Temp: 98.2 F (36.8 C)  TempSrc: Oral  SpO2: 97%  Weight: 196 lb (88.9 kg)  Height: '5\' 6"'$  (1.676 m)      Assessment & Plan:

## 2016-11-03 ENCOUNTER — Telehealth: Payer: Self-pay

## 2016-11-03 NOTE — Telephone Encounter (Signed)
Attempted to start PA. Covermymeds indicated that I did not have the correct insurance information for the patient.

## 2016-11-04 ENCOUNTER — Telehealth: Payer: Self-pay

## 2016-11-04 MED ORDER — CLOTRIMAZOLE-BETAMETHASONE 1-0.05 % EX CREA: 1.0000 "application " | TOPICAL_CREAM | Freq: Two times a day (BID) | CUTANEOUS | 0 refills | Status: DC

## 2016-11-04 NOTE — Telephone Encounter (Signed)
Medication changed

## 2016-11-04 NOTE — Addendum Note (Signed)
Addended by: Mauricio Po D on: 11/04/2016 12:34 PM   Modules accepted: Orders

## 2016-11-04 NOTE — Telephone Encounter (Signed)
Lotrisone sent to pharmacy.

## 2016-11-04 NOTE — Telephone Encounter (Signed)
Patient requesting something different from the mycolog (mystatin-triamcinolone) ointment, insurance will not cover and tried to do PA but nothing has been tried before, alternatives are Ketoconazole, Naftin, or Clotrimazole. Please advise?

## 2016-11-04 NOTE — Telephone Encounter (Signed)
Patient contacted and stated awareness 

## 2017-06-10 ENCOUNTER — Emergency Department (HOSPITAL_COMMUNITY)
Admission: EM | Admit: 2017-06-10 | Discharge: 2017-06-11 | Disposition: A | Discharge status: Home / Self Care | Payer: Medicare Other | Attending: Emergency Medicine | Admitting: Emergency Medicine

## 2017-06-10 ENCOUNTER — Encounter (HOSPITAL_COMMUNITY): Payer: Self-pay

## 2017-06-10 ENCOUNTER — Ambulatory Visit (INDEPENDENT_AMBULATORY_CARE_PROVIDER_SITE_OTHER): Payer: Medicare Other | Admitting: Internal Medicine

## 2017-06-10 ENCOUNTER — Encounter: Payer: Self-pay | Admitting: Internal Medicine

## 2017-06-10 VITALS — BP 180/90 | HR 76 | Temp 98.2°F | Ht 66.0 in | Wt 197.0 lb

## 2017-06-10 DIAGNOSIS — I10 Essential (primary) hypertension: Secondary | ICD-10-CM

## 2017-06-10 DIAGNOSIS — I160 Hypertensive urgency: Secondary | ICD-10-CM

## 2017-06-10 DIAGNOSIS — R42 Dizziness and giddiness: Secondary | ICD-10-CM | POA: Diagnosis present

## 2017-06-10 DIAGNOSIS — Z79899 Other long term (current) drug therapy: Secondary | ICD-10-CM | POA: Diagnosis not present

## 2017-06-10 LAB — I-STAT TROPONIN, ED: TROPONIN I, POC: 0 ng/mL (ref 0.00–0.08)

## 2017-06-10 LAB — BASIC METABOLIC PANEL
ANION GAP: 11 (ref 5–15)
BUN: 11 mg/dL (ref 6–20)
CALCIUM: 9.1 mg/dL (ref 8.9–10.3)
CO2: 23 mmol/L (ref 22–32)
CREATININE: 0.79 mg/dL (ref 0.61–1.24)
Chloride: 104 mmol/L (ref 101–111)
GLUCOSE: 114 mg/dL — AB (ref 65–99)
Potassium: 3.8 mmol/L (ref 3.5–5.1)
Sodium: 138 mmol/L (ref 135–145)

## 2017-06-10 LAB — CBC
HEMATOCRIT: 42.3 % (ref 39.0–52.0)
Hemoglobin: 14 g/dL (ref 13.0–17.0)
MCH: 29.3 pg (ref 26.0–34.0)
MCHC: 33.1 g/dL (ref 30.0–36.0)
MCV: 88.5 fL (ref 78.0–100.0)
PLATELETS: 232 10*3/uL (ref 150–400)
RBC: 4.78 MIL/uL (ref 4.22–5.81)
RDW: 13.9 % (ref 11.5–15.5)
WBC: 12.5 10*3/uL — AB (ref 4.0–10.5)

## 2017-06-10 NOTE — Assessment & Plan Note (Signed)
EKG with changes from prior and hypertensive urgency. BP is elevated and nausea, vomiting, headache, dizziness. Concern for stroke given pupil asymetry. His wife drove him to the office and will take him to ER immediately.

## 2017-06-10 NOTE — Progress Notes (Signed)
   Subjective:    Patient ID: Henry [name], male    DOB: 1935/07/29, 82 y.o.   MRN: [id number]  HPI The patient is a 82 YO man coming in for dizziness. Episode on Monday with instability and weakness and inability to walk. He did not seek care and it lasted a couple hours. He has been tracking BP since 05/30/17 and consistently running 190-200/70-100 which is not normal for him. No chronic blood pressure problems and is not on blood pressure medicine. Another episode of dizziness started yesterday and he is still feeling dizzy. He is also nauseous this morning with vomiting, he is not able to keep down food or drink. He is also having a headache today. Denies chest pains today.   Wife is present with him today and will drive him to the ER.   Review of Systems  Constitutional: Positive for activity change, appetite change and fatigue.  HENT: Negative.   Eyes: Negative.   Respiratory: Negative for cough, chest tightness and shortness of breath.   Cardiovascular: Negative for chest pain, palpitations and leg swelling.  Gastrointestinal: Positive for nausea and vomiting. Negative for abdominal distention, abdominal pain, constipation and diarrhea.  Musculoskeletal: Negative.   Skin: Negative.   Neurological: Positive for dizziness, weakness, light-headedness and headaches.  Psychiatric/Behavioral: Negative.       Objective:   Physical Exam  Constitutional: He is oriented to person, place, and time. He appears well-developed and well-nourished.  HENT:  Head: Normocephalic and atraumatic.  Eyes: EOM are normal.  Pupils not equal with right larger than left both reactive to light  Neck: Normal range of motion.  Cardiovascular: Normal rate and regular rhythm.   Pulmonary/Chest: Effort normal and breath sounds normal.  Abdominal: Soft. He exhibits no distension. There is no tenderness. There is no rebound.  Musculoskeletal: He exhibits no edema.  Neurological: He is alert and oriented to  person, place, and time. Coordination abnormal.  Gait unsteady   Skin: Skin is warm and dry.   Vitals:   06/10/17 1528  BP: (!) 180/90  Pulse: 76  Temp: 98.2 F (36.8 C)  TempSrc: Oral  SpO2: 98%  Weight: 197 lb (89.4 kg)  Height: 5\' 6"  (1.676 m)   EKG: Rate 83, sinus, axis normal, interval normal, new t wave depression in V3-V5 from 2013.     Assessment & Plan:

## 2017-06-10 NOTE — Patient Instructions (Signed)
We are recommending that you go to the ER for the blood pressure. There are some changes in the EKG which are likely related to the blood pressure.

## 2017-06-11 MED ORDER — AMLODIPINE BESYLATE 5 MG PO TABS: 5.0000 mg | ORAL_TABLET | Freq: Every day | ORAL | 0 refills | Status: DC

## 2017-06-11 MED ORDER — CLONIDINE HCL 0.1 MG PO TABS
0.1000 mg | ORAL_TABLET | Freq: Once | ORAL | Status: AC
  Administered 2017-06-11: 0.1 mg via ORAL
  Filled 2017-06-11: qty 1

## 2017-06-11 MED ORDER — AMLODIPINE BESYLATE 5 MG PO TABS
5.0000 mg | ORAL_TABLET | Freq: Once | ORAL | Status: AC
  Administered 2017-06-11: 5 mg via ORAL
  Filled 2017-06-11: qty 1

## 2017-06-11 NOTE — Discharge Instructions (Signed)
Return here at once if you develop a severe headache, weakness on one side of your body, persistent dizziness, chest discomfort, or any other problems. Call your doctor on Monday to schedule a follow-up visit

## 2017-06-11 NOTE — ED Provider Notes (Signed)
[facility] DEPT Provider Note   CSN: [id number] Arrival date & time: 06/10/17  1653     History   Chief Complaint Chief Complaint  Patient presents with  . Hypertension  . EKG changes    HPI Henry [name] is a 82 y.o. male.  82 year old male with no prior history of hypertension presented to his doctor's office today because of having intermittent dizziness and ringing in his ears associated high blood pressure at home. Has been keeping track of his blood pressures in the past is been 200/100. He denies any associated chest pain or back discomfort. His had no syncope or near-syncope. No palpitations. Denies any other new medication use. Symptoms wax and wane. Patient is an avid biker and states he's had no trouble with his exercise. Physician sent him here for further treatment. No treatment use prior to arrival      Past Medical History:  Diagnosis Date  . Anemia    PMH of  . Anisocoria    congenital  . Diabetes mellitus   . GERD (gastroesophageal reflux disease)   . Hyperlipidemia    mixed  . Other abnormal glucose   . Vitamin D deficiency     Patient Active Problem List   Diagnosis Date Noted  . Hypertensive urgency 06/10/2017  . Skin rash 10/29/2016  . Routine general medical examination at a health care facility 11/07/2015  . Bladder cancer ([facility]) 06/10/2014  . ANEMIA, MILD 08/03/2010  . Other nonspecific abnormal finding of lung field 08/03/2010  . Diet-controlled diabetes mellitus ([facility]) 07/31/2010  . GERD 01/20/2010  . Vitamin D deficiency 03/17/2009  . Hyperlipidemia 03/17/2009  . NONSPECIFIC ABNORMAL ELECTROCARDIOGRAM 03/17/2009    Past Surgical History:  Procedure Laterality Date  . bladder resection for cancer  1998   Dr Hartley Barefoot  . CATARACT EXTRACTION    . COLONOSCOPY  2006 & 2012   Dr Lajoyce Corners  . PENILE PROSTHESIS IMPLANT  2011   Dr Hartley Barefoot       Home Medications    Prior to Admission medications   Medication Sig Start Date End  Date Taking? Authorizing Provider  clotrimazole-betamethasone (LOTRISONE) cream Apply 1 application topically 2 (two) times daily. 11/04/16   Golden Circle, FNP  zolpidem (AMBIEN) 5 MG tablet Take 1 tablet (5 mg total) by mouth at bedtime as needed for sleep. 10/29/16   Hoyt Koch, MD    Family History Family History  Problem Relation Age of Onset  . Alcohol abuse Mother   . Stroke Mother 19  . COPD Father   . Heart disease Brother 75       stents  . Diabetes Neg Hx   . Cancer Neg Hx     Social History Social History  Substance Use Topics  . Smoking status: Never Smoker  . Smokeless tobacco: Never Used  . Alcohol use Yes     Comment:  Rarely     Allergies   Sulfa drugs cross reactors   Review of Systems Review of Systems  All other systems reviewed and are negative.    Physical Exam Updated Vital Signs BP (!) 194/94 (BP Location: Left Arm)   Pulse 79   Temp 98.3 F (36.8 C) (Oral)   Resp 18   Ht 1.676 m (5\' 6" )   Wt 89.4 kg (197 lb)   SpO2 99%   BMI 31.80 kg/m   Physical Exam  Constitutional: He is oriented to person, place, and time. He appears well-developed and well-nourished.  Non-toxic  appearance. No distress.  HENT:  Head: Normocephalic and atraumatic.  Eyes: Pupils are equal, round, and reactive to light. Conjunctivae, EOM and lids are normal.  Neck: Normal range of motion. Neck supple. No tracheal deviation present. No thyroid mass present.  Cardiovascular: Normal rate, regular rhythm and normal heart sounds.  Exam reveals no gallop.   No murmur heard. Pulmonary/Chest: Effort normal and breath sounds normal. No stridor. No respiratory distress. He has no decreased breath sounds. He has no wheezes. He has no rhonchi. He has no rales.  Abdominal: Soft. Normal appearance and bowel sounds are normal. He exhibits no distension. There is no tenderness. There is no rebound and no CVA tenderness.  Musculoskeletal: Normal range of motion. He  exhibits no edema or tenderness.  Neurological: He is alert and oriented to person, place, and time. He has normal strength. No cranial nerve deficit or sensory deficit. GCS eye subscore is 4. GCS verbal subscore is 5. GCS motor subscore is 6.  Skin: Skin is warm and dry. No abrasion and no rash noted.  Psychiatric: He has a normal mood and affect. His speech is normal and behavior is normal.  Nursing note and vitals reviewed.    ED Treatments / Results  Labs (all labs ordered are listed, but only abnormal results are displayed) Labs Reviewed  BASIC METABOLIC PANEL - Abnormal; Notable for the following:       Result Value   Glucose, Bld 114 (*)    All other components within normal limits  CBC - Abnormal; Notable for the following:    WBC 12.5 (*)    All other components within normal limits  I-STAT TROPONIN, ED    EKG  EKG Interpretation  Date/Time:  Friday June 10 2017 19:00:09 EDT Ventricular Rate:  74 PR Interval:    QRS Duration: 91 QT Interval:  407 QTC Calculation: 452 R Axis:   25 Text Interpretation:  Sinus arrhythmia Abnrm T,  anterolateral lds Baseline wander in lead(s) II III aVF No significant change since last tracing Confirmed by Lacretia Leigh ([id number]) on 06/10/2017 11:42:06 PM       Radiology No results found.  Procedures Procedures (including critical care time)  Medications Ordered in ED Medications  cloNIDine (CATAPRES) tablet 0.1 mg (not administered)  amLODipine (NORVASC) tablet 5 mg (not administered)     Initial Impression / Assessment and Plan / ED Course  I have reviewed the triage vital signs and the nursing notes.  Pertinent labs & imaging results that were available during my care of the patient were reviewed by me and considered in my medical decision making (see chart for details).     Patient medicated for high blood pressure here and does flow better. He has no focal neurological deficits on exam. Do not think that this  represents a central process. Suspect that this is from his high blood pressure. Will start patient on Norvasc and he will call his doctor Monday for a follow-up visit. Return precautions given  Final Clinical Impressions(s) / ED Diagnoses   Final diagnoses:  None    New Prescriptions New Prescriptions   No medications on file     Lacretia Leigh, MD 06/11/17 [phone]

## 2017-06-11 NOTE — ED Notes (Signed)
PT DISCHARGED. INSTRUCTIONS AND PRESCRIPTION GIVEN. AAOX4. PT IN NO APPARENT DISTRESS OR PAIN. THE OPPORTUNITY TO ASK QUESTIONS WAS PROVIDED. 

## 2017-06-15 ENCOUNTER — Encounter: Payer: Self-pay | Admitting: Internal Medicine

## 2017-06-15 ENCOUNTER — Ambulatory Visit (INDEPENDENT_AMBULATORY_CARE_PROVIDER_SITE_OTHER): Payer: Medicare Other | Admitting: Internal Medicine

## 2017-06-15 VITALS — BP 148/80 | HR 100 | Temp 98.1°F | Ht 66.0 in | Wt 197.0 lb

## 2017-06-15 DIAGNOSIS — Z23 Encounter for immunization: Secondary | ICD-10-CM

## 2017-06-15 DIAGNOSIS — I160 Hypertensive urgency: Secondary | ICD-10-CM | POA: Diagnosis not present

## 2017-06-15 MED ORDER — ZOLPIDEM TARTRATE 5 MG PO TABS: 5.0000 mg | ORAL_TABLET | Freq: Every evening | ORAL | 5 refills | Status: AC | PRN

## 2017-06-15 MED ORDER — AMLODIPINE BESYLATE 10 MG PO TABS: 10.0000 mg | ORAL_TABLET | Freq: Every day | ORAL | 1 refills | Status: AC

## 2017-06-15 MED ORDER — AMLODIPINE BESYLATE 10 MG PO TABS: 10.0000 mg | ORAL_TABLET | Freq: Every day | ORAL | 1 refills | Status: DC

## 2017-06-15 NOTE — Assessment & Plan Note (Signed)
Resolved, will increase amlodipine to 10 mg daily to help bring BP to goal. Labs reviewed from ER and normal.

## 2017-06-15 NOTE — Progress Notes (Signed)
   Subjective:    Patient ID: Henry [name], male    DOB: 04-17-1935, 82 y.o.   MRN: [id number]  HPI The patient is an 82 YO man coming in for ER follow up (sent in with hypertensive urgency and started on amlodipine 5 mg daily). He has been taking this and is feeling better overall. Checking BP at home and it is running about 145-155/80s at home. He denies numbness or weakness. No dizziness anymore since BP running lower. Denies falls. No headaches or chest pains. He is complaining about the wait at the ER and he feels that they did not do anything for him.   Review of Systems  Constitutional: Negative.   HENT: Negative.   Eyes: Negative.   Respiratory: Negative for cough, chest tightness and shortness of breath.   Cardiovascular: Negative for chest pain, palpitations and leg swelling.  Gastrointestinal: Negative for abdominal distention, abdominal pain, constipation, diarrhea, nausea and vomiting.  Musculoskeletal: Negative.   Skin: Negative.   Neurological: Negative.   Psychiatric/Behavioral: Negative.       Objective:   Physical Exam  Constitutional: He is oriented to person, place, and time. He appears well-developed and well-nourished.  HENT:  Head: Normocephalic and atraumatic.  Eyes: EOM are normal.  Neck: Normal range of motion.  Cardiovascular: Normal rate and regular rhythm.   Pulmonary/Chest: Effort normal and breath sounds normal. No respiratory distress. He has no wheezes. He has no rales.  Abdominal: Soft. Bowel sounds are normal. He exhibits no distension. There is no tenderness. There is no rebound.  Musculoskeletal: He exhibits no edema.  Neurological: He is alert and oriented to person, place, and time. Coordination normal.  Skin: Skin is warm and dry.  Psychiatric: He has a normal mood and affect.   Vitals:   06/15/17 0835  BP: (!) 148/80  Pulse: 100  Temp: 98.1 F (36.7 C)  TempSrc: Oral  SpO2: 98%  Weight: 197 lb (89.4 kg)  Height: 5\' 6"  (1.676 m)        Assessment & Plan:  Flu shot given at visit

## 2017-06-15 NOTE — Patient Instructions (Signed)
We will have you increase the amlodipine to 10 mg daily. Until you run out take 2 pills daily. When you fill the new prescription it will be for 10 mg pills so you will just take 1 pill daily.

## 2017-07-06 DIAGNOSIS — B9620 Unspecified Escherichia coli [E. coli] as the cause of diseases classified elsewhere: Secondary | ICD-10-CM | POA: Diagnosis not present

## 2017-07-06 DIAGNOSIS — Z8551 Personal history of malignant neoplasm of bladder: Secondary | ICD-10-CM | POA: Diagnosis not present

## 2017-07-06 DIAGNOSIS — N390 Urinary tract infection, site not specified: Secondary | ICD-10-CM | POA: Diagnosis not present

## 2017-08-01 ENCOUNTER — Ambulatory Visit: Payer: Self-pay | Admitting: *Deleted

## 2017-08-01 NOTE — Telephone Encounter (Signed)
Route to CMA °

## 2017-08-01 NOTE — Telephone Encounter (Signed)
Per chart appt was made for 08/04/17 w/Dr. Sharlet Salina...Johny Chess

## 2017-08-01 NOTE — Telephone Encounter (Signed)
   Reason for Disposition . Cough lasts > 3 weeks  Answer Assessment - Initial Assessment Questions 1. ONSET: "When did the cough begin?"      After patient began medication 2. SEVERITY: "How bad is the cough today?"      Has spells- comes/goes- getting worse 3. RESPIRATORY DISTRESS: "Describe your breathing."      Does not effect breathing- is making chest hurt 4. FEVER: "Do you have a fever?" If so, ask: "What is your temperature, how was it measured, and when did it start?"     no 5. HEMOPTYSIS: "Are you coughing up any blood?" If so ask: "How much?" (flecks, streaks, tablespoons, etc.)     no 6. TREATMENT: "What have you done so far to treat the cough?" (e.g., meds, fluids, humidifier)     no 7. CARDIAC HISTORY: "Do you have any history of heart disease?" (e.g., heart attack, congestive heart failure)      no 8. LUNG HISTORY: "Do you have any history of lung disease?"  (e.g., pulmonary embolus, asthma, emphysema)     no 9. PE RISK FACTORS: "Do you have a history of blood clots?" (or: recent major surgery, recent prolonged travel, bedridden )     no 10. OTHER SYMPTOMS: "Do you have any other symptoms? (e.g., runny nose, wheezing, chest pain)       No- soreness form cough 11. PREGNANCY: "Is there any chance you are pregnant?" "When was your last menstrual period?"       n/a 12. TRAVEL: "Have you traveled out of the country in the last month?" (e.g., travel history, exposures)       n/a  Protocols used: COUGH - CHRONIC-A-AH, COUGH - ACUTE NON-PRODUCTIVE-A-AH

## 2017-08-04 ENCOUNTER — Ambulatory Visit: Payer: Medicare Other | Admitting: Internal Medicine

## 2017-08-08 DIAGNOSIS — I493 Ventricular premature depolarization: Secondary | ICD-10-CM | POA: Diagnosis not present

## 2017-08-08 DIAGNOSIS — Z881 Allergy status to other antibiotic agents status: Secondary | ICD-10-CM | POA: Diagnosis not present

## 2017-08-08 DIAGNOSIS — R5383 Other fatigue: Secondary | ICD-10-CM | POA: Diagnosis not present

## 2017-08-08 DIAGNOSIS — R000 Tachycardia, unspecified: Secondary | ICD-10-CM | POA: Diagnosis not present

## 2017-08-08 DIAGNOSIS — Z79899 Other long term (current) drug therapy: Secondary | ICD-10-CM | POA: Diagnosis not present

## 2017-08-08 DIAGNOSIS — R9431 Abnormal electrocardiogram [ECG] [EKG]: Secondary | ICD-10-CM | POA: Diagnosis not present

## 2017-08-08 DIAGNOSIS — R918 Other nonspecific abnormal finding of lung field: Secondary | ICD-10-CM | POA: Diagnosis not present

## 2017-08-08 DIAGNOSIS — J90 Pleural effusion, not elsewhere classified: Secondary | ICD-10-CM | POA: Diagnosis not present

## 2017-08-08 DIAGNOSIS — R079 Chest pain, unspecified: Secondary | ICD-10-CM | POA: Diagnosis not present

## 2017-08-08 DIAGNOSIS — R0600 Dyspnea, unspecified: Secondary | ICD-10-CM | POA: Diagnosis not present

## 2017-08-08 DIAGNOSIS — C3431 Malignant neoplasm of lower lobe, right bronchus or lung: Secondary | ICD-10-CM | POA: Diagnosis not present

## 2017-08-08 DIAGNOSIS — I517 Cardiomegaly: Secondary | ICD-10-CM | POA: Diagnosis not present

## 2017-08-08 DIAGNOSIS — M549 Dorsalgia, unspecified: Secondary | ICD-10-CM | POA: Diagnosis not present

## 2017-08-08 DIAGNOSIS — I10 Essential (primary) hypertension: Secondary | ICD-10-CM | POA: Diagnosis not present

## 2017-08-08 DIAGNOSIS — C679 Malignant neoplasm of bladder, unspecified: Secondary | ICD-10-CM | POA: Diagnosis not present

## 2017-08-08 DIAGNOSIS — R0602 Shortness of breath: Secondary | ICD-10-CM | POA: Diagnosis not present

## 2017-08-08 DIAGNOSIS — C7801 Secondary malignant neoplasm of right lung: Secondary | ICD-10-CM | POA: Diagnosis not present

## 2017-08-08 DIAGNOSIS — R05 Cough: Secondary | ICD-10-CM | POA: Diagnosis not present

## 2017-08-08 DIAGNOSIS — R739 Hyperglycemia, unspecified: Secondary | ICD-10-CM | POA: Diagnosis not present

## 2017-08-08 MED ORDER — OXYCODONE HCL 5 MG PO TABS
5.00 | ORAL_TABLET | ORAL | Status: DC
Start: ? — End: 2017-08-08

## 2017-08-12 ENCOUNTER — Ambulatory Visit (INDEPENDENT_AMBULATORY_CARE_PROVIDER_SITE_OTHER): Payer: Medicare Other | Admitting: Internal Medicine

## 2017-08-12 ENCOUNTER — Encounter: Payer: Self-pay | Admitting: Internal Medicine

## 2017-08-12 VITALS — BP 160/80 | HR 91 | Temp 98.3°F | Ht 66.0 in

## 2017-08-12 DIAGNOSIS — I10 Essential (primary) hypertension: Secondary | ICD-10-CM

## 2017-08-12 DIAGNOSIS — Z8551 Personal history of malignant neoplasm of bladder: Secondary | ICD-10-CM | POA: Diagnosis not present

## 2017-08-12 DIAGNOSIS — R918 Other nonspecific abnormal finding of lung field: Secondary | ICD-10-CM

## 2017-08-12 MED ORDER — TRAMADOL HCL 50 MG PO TABS: 50.0000 mg | ORAL_TABLET | ORAL | 0 refills | Status: AC | PRN

## 2017-08-12 NOTE — Assessment & Plan Note (Signed)
Mediastinal and right hilar adenopathy. Left prevascular lymph node measures 2.2 x 1.4 cm in conglomerate lymph node mass subcarinal region is 4 x 3.5 cm. 4.3 x 3.8 cm right lower lobe mass. We did discuss that this is likely cancerous and could be lung primary as opposed to metastatic from the bladder cancer (which is remote at this time). Review of chart reveals CT angio from 2011 with lesion right lower lobe done to rule out PE which was not followed. It is unclear in the medical record who ordered imaging or who was aware of the results. Patient informed about this and does not recall recommendation to follow at that time or about the spot in his lung and given the information that this could be related to his current mass or could be a separate spot which is not related. Referral to pulmonary for sampling (possibly lymph node or mass whichever is more accessible). Patient is previously fit with cycling. Rx for tramadol for pain as the oxycodone is causing him some locking of his knees and mild rash. Advised to continue stool softener.

## 2017-08-12 NOTE — Patient Instructions (Signed)
We will get you in with the oncologist and the lung specialist.   We have sent in tramadol for the pain that you can take every 4 hours.

## 2017-08-12 NOTE — Progress Notes (Signed)
   Subjective:    Patient ID: Henry [name], male    DOB: Aug 03, 1935, 82 y.o.   MRN: [id number]  HPI The patient is an 82 YO man coming in for ER visit for cough which discovered 3-4-4 cm mass RLL which is very suspicious for malignancy. He has been coughing in the last several months. Denies SOB. Still was exercising until recently. He got pain in his chest recently and this is why he sought care. He was given oxycodone for the pain in his chest and this has helped with the pain (taking every 8 hours) but is causing mild constipation, rash, and locking of his knees which has caused him to fall twice. He is taking stool softener which is helping with constipation. He denies stomach pain, nausea, vomiting. Denies palpitations. Not coughing up anything but coughing. Not keeping him from sleeping.   PMH, [facility], social history reviewed and updated.   Review of Systems  Constitutional: Positive for activity change. Negative for appetite change, fatigue and fever.  HENT: Negative.   Eyes: Negative.   Respiratory: Positive for cough and chest tightness. Negative for shortness of breath.   Cardiovascular: Positive for chest pain. Negative for palpitations and leg swelling.  Gastrointestinal: Positive for constipation. Negative for abdominal distention, abdominal pain, diarrhea, nausea and vomiting.  Musculoskeletal: Negative.   Skin: Positive for rash.  Neurological: Negative.   Psychiatric/Behavioral: Negative.       Objective:   Physical Exam  Constitutional: He is oriented to person, place, and time. He appears well-developed and well-nourished.  HENT:  Head: Normocephalic and atraumatic.  Eyes: EOM are normal.  Neck: Normal range of motion.  Cardiovascular: Normal rate and regular rhythm.  Pulmonary/Chest: Effort normal and breath sounds normal. No respiratory distress. He has no wheezes. He has no rales. He exhibits tenderness.  Pain not with palpation in the right chest area.     Abdominal: Soft. Bowel sounds are normal. He exhibits no distension. There is no tenderness. There is no rebound.  Musculoskeletal: He exhibits no edema.  Neurological: He is alert and oriented to person, place, and time. Coordination normal.  Skin: Skin is warm and dry.  Psychiatric:  Mood appropriately down   Vitals:   08/12/17 0925  BP: (!) 160/80  Pulse: 91  Temp: 98.3 F (36.8 C)  TempSrc: Oral  SpO2: 93%  Height: 5\' 6"  (1.676 m)      Assessment & Plan:

## 2017-08-12 NOTE — Assessment & Plan Note (Signed)
20 years out from resection without evidence for recurrence. CXR at urology in the last 2 months without changes. We talked about how this may not be related to his recent lung mass.

## 2017-08-12 NOTE — Assessment & Plan Note (Signed)
Has not taken meds today. Previously well controlled on amlodipine 10 mg daily and will continue.

## 2017-08-19 ENCOUNTER — Encounter: Payer: Self-pay | Admitting: Pulmonary Disease

## 2017-08-19 ENCOUNTER — Telehealth: Payer: Self-pay | Admitting: Pulmonary Disease

## 2017-08-19 ENCOUNTER — Ambulatory Visit: Payer: Medicare Other | Admitting: Pulmonary Disease

## 2017-08-19 DIAGNOSIS — R918 Other nonspecific abnormal finding of lung field: Secondary | ICD-10-CM | POA: Diagnosis not present

## 2017-08-19 DIAGNOSIS — R911 Solitary pulmonary nodule: Secondary | ICD-10-CM

## 2017-08-19 NOTE — Assessment & Plan Note (Addendum)
Unfortunately this has spread to hilar and mediastinal lymph nodes.  Also has a small effusion-unclear if this is large enough to tap  We discussed biopsy options including thoracentesis, CT-guided biopsy of lung mass and bronchoscopy with endobronchial ultrasound for subcarinal lymph node. Discussed risks and benefits. We will proceed with PET scan for staging-she mildly tender in the mid thoracic spine and no vertebral metastasis detected on CT scan. And CT-guided biopsy of lung mass, will ask IR to perform thoracentesis ultrasound-guided at the same time if significant fluid present and sent for cytology.  I suspect that we will need more tissue to send markers in this never smoker  Greater than 50% time was spent in counseling and coordination of care with the patient

## 2017-08-19 NOTE — Progress Notes (Signed)
Subjective:    Patient ID: Henry [name], male    DOB: 1935/04/16, 82 y.o.   MRN: [id number]  HPI  82 year old never smoker presents for evaluation of right lower lobe lung mass noted on imaging. He developed acute onset cough without URI symptoms with pain in his back in the interscapular region about 2 weeks ago after returning from a trip to [place].  He has just been feeling unwell since then.  He went to know on urgent care where chest x-ray was obtained.  He also underwent a CT chest which I reviewed.  This showed a right lower lobe lung mass measuring 4.3 x 3.8 cm with a small right effusion right hilar and mediastinal lymphadenopathy was noted especially in the subcarinal region measuring 4 x 3.5 cm.  Calcified granuloma is also noted in the left lower lobe.  I have reviewed his prior imaging in our system including clear chest x-ray from 03/2015. Data CT chest in 07/2010 which showed 7 x 9 x 12 mm nodule in the right lower lobe and other smaller calcified nodule.  He denies hemoptysis, cough production or Loss of appetite or weight.  There is no history of exposure to TB.  He has hypertension controlled with one medication.  Tramadol has helped with his back pain  He grew up in [place] and has lived for 4 years and [place] before moving to [place] where he lived for the last 40 years.  She works for [employer]. He rides a bike. He has a family history of emphysema and lung cancer in his father, and older brother has dementia and recently went into assisted living  Past Medical History:  Diagnosis Date  . Anemia    PMH of  . Anisocoria    congenital  . Diabetes mellitus   . GERD (gastroesophageal reflux disease)   . Hyperlipidemia    mixed  . Other abnormal glucose   . Vitamin D deficiency     Past Surgical History:  Procedure Laterality Date  . bladder resection for cancer  1998   Dr Hartley Barefoot  . CATARACT EXTRACTION    . COLONOSCOPY  2006 & 2012   Dr Lajoyce Corners   . PENILE PROSTHESIS IMPLANT  2011   Dr Hartley Barefoot    Allergies  Allergen Reactions  . Sulfa Drugs Cross Reactors     rash     Social History   Socioeconomic History  . Marital status: Married    Spouse name: Not on file  . Number of children: Not on file  . Years of education: Not on file  . Highest education level: Not on file  Social Needs  . Financial resource strain: Not on file  . Food insecurity - worry: Not on file  . Food insecurity - inability: Not on file  . Transportation needs - medical: Not on file  . Transportation needs - non-medical: Not on file  Occupational History  . Not on file  Tobacco Use  . Smoking status: Never Smoker  . Smokeless tobacco: Never Used  Substance and Sexual Activity  . Alcohol use: Yes    Comment:  Rarely  . Drug use: No  . Sexual activity: Not on file  Other Topics Concern  . Not on file  Social History Narrative  . Not on file     Review of Systems  Positive for nonproductive cough, acid heartburn and pain in back intrascapular region  Constitutional: negative for anorexia, fevers and sweats  Eyes: negative  for irritation, redness and visual disturbance  Ears, nose, mouth, throat, and face: negative for earaches, epistaxis, nasal congestion and sore throat  Respiratory: negative for dyspnea on exertion, sputum and wheezing  Cardiovascular: negative for chest pain, dyspnea, lower extremity edema, orthopnea, palpitations and syncope  Gastrointestinal: negative for abdominal pain, constipation, diarrhea, melena, nausea and vomiting  Genitourinary:negative for dysuria, frequency and hematuria  Hematologic/lymphatic: negative for bleeding, easy bruising and lymphadenopathy  Musculoskeletal:negative for arthralgias, muscle weakness and stiff joints  Neurological: negative for coordination problems, gait problems, headaches and weakness  Endocrine: negative for diabetic symptoms including polydipsia, polyuria and weight  loss     Objective:   Physical Exam  Gen. Pleasant, well-nourished, in no distress, normal affect ENT - no lesions, no post nasal drip Neck: No JVD, no thyromegaly, no carotid bruits Lungs: no use of accessory muscles, no dullness to percussion, clear without rales or rhonchi  Cardiovascular: Rhythm regular, heart sounds  normal, no murmurs or gallops, no peripheral edema Abdomen: soft and non-tender, no hepatosplenomegaly, BS normal. Musculoskeletal: No deformities, no cyanosis or clubbing, mild tenderness mid thoracic spine Neuro:  alert, non focal       Assessment & Plan:

## 2017-08-19 NOTE — Patient Instructions (Signed)
We discussed biopsy options Proceed with CT-guided biopsy of lung mass and draw out fluid at the same time if possible.  Schedule PET scan

## 2017-08-19 NOTE — Telephone Encounter (Signed)
Spoke with Vivien Rota, there was an order placed for biopsy. His  pet scan is on 1/10 so she wanted to let RA know that the order will stay in review because he has to have the PET scan done first. Just wanted to let RA know. FYI.

## 2017-08-22 NOTE — Telephone Encounter (Signed)
Pl let them know that they can proceed with biopsy - does not need PET scan to be completed before - targets are obvious Pl let them know to discuss with IR MD & decide

## 2017-08-24 ENCOUNTER — Telehealth: Payer: Self-pay | Admitting: Pulmonary Disease

## 2017-08-24 ENCOUNTER — Institutional Professional Consult (permissible substitution): Payer: Medicare Other | Admitting: Internal Medicine

## 2017-08-24 NOTE — Telephone Encounter (Signed)
Called pt and advised message from the provider. Pt understood and verbalized understanding. Nothing further is needed.    

## 2017-08-24 NOTE — Telephone Encounter (Signed)
Spoke with patient. He was seen by RA last week 12/21 for pleural effusion and lung nodules. He stated that during his OV he had some upper back pain.   Now almost a week later, the pain has increased and his right shoulder is hurting as well. Increased SOB. He does have a cough but is unable to cough anything up.   Wishes to use [facility] on [address].   MW, please advise. Thanks!

## 2017-08-24 NOTE — Telephone Encounter (Signed)
Already has tramadol and if can't get comfortable on up to 2 q 4 h prn will need to be seen either here or go to er if breathing also an issue as fluid may need to be removed and can't do this here

## 2017-08-30 ENCOUNTER — Emergency Department (HOSPITAL_COMMUNITY): Payer: Medicare Other

## 2017-08-30 ENCOUNTER — Other Ambulatory Visit: Payer: Self-pay

## 2017-08-30 ENCOUNTER — Encounter (HOSPITAL_COMMUNITY): Payer: Self-pay | Admitting: Emergency Medicine

## 2017-08-30 ENCOUNTER — Inpatient Hospital Stay (HOSPITAL_COMMUNITY)
Admission: EM | Admit: 2017-08-30 | Discharge: 2017-09-30 | DRG: 208 | Disposition: E | Payer: Medicare Other | Attending: Pulmonary Disease | Admitting: Pulmonary Disease

## 2017-08-30 DIAGNOSIS — C3412 Malignant neoplasm of upper lobe, left bronchus or lung: Secondary | ICD-10-CM | POA: Diagnosis not present

## 2017-08-30 DIAGNOSIS — E785 Hyperlipidemia, unspecified: Secondary | ICD-10-CM | POA: Diagnosis not present

## 2017-08-30 DIAGNOSIS — Z825 Family history of asthma and other chronic lower respiratory diseases: Secondary | ICD-10-CM

## 2017-08-30 DIAGNOSIS — J9811 Atelectasis: Secondary | ICD-10-CM | POA: Diagnosis not present

## 2017-08-30 DIAGNOSIS — E782 Mixed hyperlipidemia: Secondary | ICD-10-CM | POA: Diagnosis present

## 2017-08-30 DIAGNOSIS — Z436 Encounter for attention to other artificial openings of urinary tract: Secondary | ICD-10-CM | POA: Diagnosis not present

## 2017-08-30 DIAGNOSIS — C3491 Malignant neoplasm of unspecified part of right bronchus or lung: Secondary | ICD-10-CM | POA: Diagnosis not present

## 2017-08-30 DIAGNOSIS — R590 Localized enlarged lymph nodes: Secondary | ICD-10-CM

## 2017-08-30 DIAGNOSIS — Z9849 Cataract extraction status, unspecified eye: Secondary | ICD-10-CM

## 2017-08-30 DIAGNOSIS — R0600 Dyspnea, unspecified: Secondary | ICD-10-CM | POA: Diagnosis not present

## 2017-08-30 DIAGNOSIS — Z66 Do not resuscitate: Secondary | ICD-10-CM | POA: Diagnosis not present

## 2017-08-30 DIAGNOSIS — K210 Gastro-esophageal reflux disease with esophagitis: Secondary | ICD-10-CM | POA: Diagnosis not present

## 2017-08-30 DIAGNOSIS — E878 Other disorders of electrolyte and fluid balance, not elsewhere classified: Secondary | ICD-10-CM | POA: Diagnosis not present

## 2017-08-30 DIAGNOSIS — M899 Disorder of bone, unspecified: Secondary | ICD-10-CM | POA: Diagnosis present

## 2017-08-30 DIAGNOSIS — R918 Other nonspecific abnormal finding of lung field: Secondary | ICD-10-CM | POA: Diagnosis not present

## 2017-08-30 DIAGNOSIS — J9601 Acute respiratory failure with hypoxia: Secondary | ICD-10-CM

## 2017-08-30 DIAGNOSIS — C3431 Malignant neoplasm of lower lobe, right bronchus or lung: Secondary | ICD-10-CM | POA: Diagnosis not present

## 2017-08-30 DIAGNOSIS — Y92230 Patient room in hospital as the place of occurrence of the external cause: Secondary | ICD-10-CM | POA: Diagnosis present

## 2017-08-30 DIAGNOSIS — Z6830 Body mass index (BMI) 30.0-30.9, adult: Secondary | ICD-10-CM

## 2017-08-30 DIAGNOSIS — Z515 Encounter for palliative care: Secondary | ICD-10-CM

## 2017-08-30 DIAGNOSIS — K219 Gastro-esophageal reflux disease without esophagitis: Secondary | ICD-10-CM | POA: Diagnosis present

## 2017-08-30 DIAGNOSIS — D638 Anemia in other chronic diseases classified elsewhere: Secondary | ICD-10-CM | POA: Diagnosis present

## 2017-08-30 DIAGNOSIS — J440 Chronic obstructive pulmonary disease with acute lower respiratory infection: Secondary | ICD-10-CM | POA: Diagnosis present

## 2017-08-30 DIAGNOSIS — E872 Acidosis: Secondary | ICD-10-CM | POA: Diagnosis not present

## 2017-08-30 DIAGNOSIS — Z8551 Personal history of malignant neoplasm of bladder: Secondary | ICD-10-CM | POA: Diagnosis not present

## 2017-08-30 DIAGNOSIS — E1165 Type 2 diabetes mellitus with hyperglycemia: Secondary | ICD-10-CM | POA: Diagnosis not present

## 2017-08-30 DIAGNOSIS — R0902 Hypoxemia: Secondary | ICD-10-CM | POA: Diagnosis not present

## 2017-08-30 DIAGNOSIS — E870 Hyperosmolality and hypernatremia: Secondary | ICD-10-CM

## 2017-08-30 DIAGNOSIS — Z811 Family history of alcohol abuse and dependence: Secondary | ICD-10-CM

## 2017-08-30 DIAGNOSIS — Z79899 Other long term (current) drug therapy: Secondary | ICD-10-CM

## 2017-08-30 DIAGNOSIS — Z5320 Procedure and treatment not carried out because of patient's decision for unspecified reasons: Secondary | ICD-10-CM | POA: Diagnosis present

## 2017-08-30 DIAGNOSIS — R0603 Acute respiratory distress: Secondary | ICD-10-CM

## 2017-08-30 DIAGNOSIS — T380X5A Adverse effect of glucocorticoids and synthetic analogues, initial encounter: Secondary | ICD-10-CM | POA: Diagnosis present

## 2017-08-30 DIAGNOSIS — J189 Pneumonia, unspecified organism: Secondary | ICD-10-CM | POA: Diagnosis not present

## 2017-08-30 DIAGNOSIS — Z882 Allergy status to sulfonamides status: Secondary | ICD-10-CM

## 2017-08-30 DIAGNOSIS — R609 Edema, unspecified: Secondary | ICD-10-CM | POA: Diagnosis not present

## 2017-08-30 DIAGNOSIS — Z452 Encounter for adjustment and management of vascular access device: Secondary | ICD-10-CM | POA: Diagnosis not present

## 2017-08-30 DIAGNOSIS — Z978 Presence of other specified devices: Secondary | ICD-10-CM

## 2017-08-30 DIAGNOSIS — E119 Type 2 diabetes mellitus without complications: Secondary | ICD-10-CM

## 2017-08-30 DIAGNOSIS — Z9911 Dependence on respirator [ventilator] status: Secondary | ICD-10-CM | POA: Diagnosis not present

## 2017-08-30 DIAGNOSIS — F419 Anxiety disorder, unspecified: Secondary | ICD-10-CM | POA: Diagnosis present

## 2017-08-30 DIAGNOSIS — J9621 Acute and chronic respiratory failure with hypoxia: Secondary | ICD-10-CM | POA: Diagnosis present

## 2017-08-30 DIAGNOSIS — Z8249 Family history of ischemic heart disease and other diseases of the circulatory system: Secondary | ICD-10-CM

## 2017-08-30 DIAGNOSIS — Z7189 Other specified counseling: Secondary | ICD-10-CM | POA: Diagnosis not present

## 2017-08-30 DIAGNOSIS — I10 Essential (primary) hypertension: Secondary | ICD-10-CM | POA: Diagnosis present

## 2017-08-30 DIAGNOSIS — Z906 Acquired absence of other parts of urinary tract: Secondary | ICD-10-CM

## 2017-08-30 DIAGNOSIS — Z823 Family history of stroke: Secondary | ICD-10-CM

## 2017-08-30 DIAGNOSIS — Z79891 Long term (current) use of opiate analgesic: Secondary | ICD-10-CM

## 2017-08-30 DIAGNOSIS — Z4659 Encounter for fitting and adjustment of other gastrointestinal appliance and device: Secondary | ICD-10-CM

## 2017-08-30 DIAGNOSIS — R000 Tachycardia, unspecified: Secondary | ICD-10-CM | POA: Diagnosis present

## 2017-08-30 DIAGNOSIS — J90 Pleural effusion, not elsewhere classified: Secondary | ICD-10-CM

## 2017-08-30 DIAGNOSIS — M898X8 Other specified disorders of bone, other site: Secondary | ICD-10-CM

## 2017-08-30 DIAGNOSIS — E876 Hypokalemia: Secondary | ICD-10-CM | POA: Diagnosis not present

## 2017-08-30 DIAGNOSIS — C3400 Malignant neoplasm of unspecified main bronchus: Secondary | ICD-10-CM | POA: Diagnosis not present

## 2017-08-30 DIAGNOSIS — C3411 Malignant neoplasm of upper lobe, right bronchus or lung: Secondary | ICD-10-CM | POA: Diagnosis not present

## 2017-08-30 DIAGNOSIS — Z791 Long term (current) use of non-steroidal anti-inflammatories (NSAID): Secondary | ICD-10-CM

## 2017-08-30 DIAGNOSIS — G4700 Insomnia, unspecified: Secondary | ICD-10-CM | POA: Diagnosis present

## 2017-08-30 DIAGNOSIS — H5702 Anisocoria: Secondary | ICD-10-CM | POA: Diagnosis present

## 2017-08-30 DIAGNOSIS — E559 Vitamin D deficiency, unspecified: Secondary | ICD-10-CM | POA: Diagnosis present

## 2017-08-30 DIAGNOSIS — C3490 Malignant neoplasm of unspecified part of unspecified bronchus or lung: Secondary | ICD-10-CM | POA: Diagnosis not present

## 2017-08-30 DIAGNOSIS — J9690 Respiratory failure, unspecified, unspecified whether with hypoxia or hypercapnia: Secondary | ICD-10-CM | POA: Diagnosis not present

## 2017-08-30 DIAGNOSIS — C3401 Malignant neoplasm of right main bronchus: Secondary | ICD-10-CM | POA: Diagnosis not present

## 2017-08-30 DIAGNOSIS — E860 Dehydration: Secondary | ICD-10-CM | POA: Diagnosis not present

## 2017-08-30 DIAGNOSIS — E669 Obesity, unspecified: Secondary | ICD-10-CM | POA: Diagnosis present

## 2017-08-30 DIAGNOSIS — Z4682 Encounter for fitting and adjustment of non-vascular catheter: Secondary | ICD-10-CM | POA: Diagnosis not present

## 2017-08-30 DIAGNOSIS — C771 Secondary and unspecified malignant neoplasm of intrathoracic lymph nodes: Secondary | ICD-10-CM | POA: Diagnosis not present

## 2017-08-30 LAB — CBC WITH DIFFERENTIAL/PLATELET
BASOS ABS: 0 10*3/uL (ref 0.0–0.1)
BASOS PCT: 0 %
EOS ABS: 0.1 10*3/uL (ref 0.0–0.7)
Eosinophils Relative: 1 %
HEMATOCRIT: 39.5 % (ref 39.0–52.0)
HEMOGLOBIN: 13.1 g/dL (ref 13.0–17.0)
Lymphocytes Relative: 10 %
Lymphs Abs: 1.9 10*3/uL (ref 0.7–4.0)
MCH: 28.5 pg (ref 26.0–34.0)
MCHC: 33.2 g/dL (ref 30.0–36.0)
MCV: 85.9 fL (ref 78.0–100.0)
Monocytes Absolute: 1.9 10*3/uL — ABNORMAL HIGH (ref 0.1–1.0)
Monocytes Relative: 10 %
NEUTROS ABS: 15.7 10*3/uL — AB (ref 1.7–7.7)
NEUTROS PCT: 79 %
Platelets: 337 10*3/uL (ref 150–400)
RBC: 4.6 MIL/uL (ref 4.22–5.81)
RDW: 14.2 % (ref 11.5–15.5)
WBC: 19.7 10*3/uL — AB (ref 4.0–10.5)

## 2017-08-30 LAB — GLUCOSE, CAPILLARY
GLUCOSE-CAPILLARY: 167 mg/dL — AB (ref 65–99)
Glucose-Capillary: 184 mg/dL — ABNORMAL HIGH (ref 65–99)

## 2017-08-30 LAB — INFLUENZA PANEL BY PCR (TYPE A & B)
INFLAPCR: NEGATIVE
INFLBPCR: NEGATIVE

## 2017-08-30 LAB — BASIC METABOLIC PANEL
Anion gap: 12 (ref 5–15)
BUN: 16 mg/dL (ref 6–20)
CALCIUM: 9.8 mg/dL (ref 8.9–10.3)
CO2: 27 mmol/L (ref 22–32)
CREATININE: 0.87 mg/dL (ref 0.61–1.24)
Chloride: 95 mmol/L — ABNORMAL LOW (ref 101–111)
Glucose, Bld: 186 mg/dL — ABNORMAL HIGH (ref 65–99)
Potassium: 3.7 mmol/L (ref 3.5–5.1)
SODIUM: 134 mmol/L — AB (ref 135–145)

## 2017-08-30 LAB — I-STAT CG4 LACTIC ACID, ED
Lactic Acid, Venous: 2.79 mmol/L (ref 0.5–1.9)
Lactic Acid, Venous: 2.98 mmol/L (ref 0.5–1.9)

## 2017-08-30 LAB — HEMOGLOBIN A1C
HEMOGLOBIN A1C: 6.7 % — AB (ref 4.8–5.6)
MEAN PLASMA GLUCOSE: 145.59 mg/dL

## 2017-08-30 LAB — D-DIMER, QUANTITATIVE (NOT AT ARMC): D DIMER QUANT: 3.41 ug{FEU}/mL — AB (ref 0.00–0.50)

## 2017-08-30 MED ORDER — ONDANSETRON HCL 4 MG PO TABS: 4.0000 mg | ORAL_TABLET | Freq: Four times a day (QID) | ORAL | Status: DC | PRN

## 2017-08-30 MED ORDER — AMOXICILLIN-POT CLAVULANATE 875-125 MG PO TABS
1.0000 | ORAL_TABLET | Freq: Two times a day (BID) | ORAL | Status: DC
  Administered 2017-08-30 – 2017-08-31 (×2): 1 via ORAL
  Filled 2017-08-30 (×2): qty 1

## 2017-08-30 MED ORDER — INSULIN ASPART 100 UNIT/ML ~~LOC~~ SOLN: 0.0000 [IU] | Freq: Every day | SUBCUTANEOUS | Status: DC

## 2017-08-30 MED ORDER — IPRATROPIUM-ALBUTEROL 0.5-2.5 (3) MG/3ML IN SOLN
3.0000 mL | Freq: Four times a day (QID) | RESPIRATORY_TRACT | Status: DC
  Administered 2017-08-30 – 2017-08-31 (×4): 3 mL via RESPIRATORY_TRACT
  Filled 2017-08-30 (×4): qty 3

## 2017-08-30 MED ORDER — MORPHINE SULFATE (PF) 4 MG/ML IV SOLN
3.0000 mg | INTRAVENOUS | Status: DC | PRN
  Administered 2017-08-30 – 2017-08-31 (×7): 3 mg via INTRAVENOUS
  Filled 2017-08-30 (×6): qty 1

## 2017-08-30 MED ORDER — IOPAMIDOL (ISOVUE-370) INJECTION 76%
100.0000 mL | Freq: Once | INTRAVENOUS | Status: AC | PRN
  Administered 2017-08-30: 100 mL via INTRAVENOUS

## 2017-08-30 MED ORDER — METHYLPREDNISOLONE SODIUM SUCC 125 MG IJ SOLR
60.0000 mg | Freq: Three times a day (TID) | INTRAMUSCULAR | Status: DC
Start: 2017-08-30 — End: 2017-09-03
  Administered 2017-08-30 – 2017-09-03 (×11): 60 mg via INTRAVENOUS
  Filled 2017-08-30 (×11): qty 2

## 2017-08-30 MED ORDER — SODIUM CHLORIDE 0.9 % IV SOLN
INTRAVENOUS | Status: DC
  Administered 2017-08-30 – 2017-08-31 (×2): via INTRAVENOUS

## 2017-08-30 MED ORDER — MORPHINE SULFATE (PF) 2 MG/ML IV SOLN
2.0000 mg | Freq: Once | INTRAVENOUS | Status: AC
  Administered 2017-08-30: 2 mg via INTRAVENOUS
  Filled 2017-08-30: qty 1

## 2017-08-30 MED ORDER — VANCOMYCIN HCL IN DEXTROSE 1-5 GM/200ML-% IV SOLN
1000.0000 mg | Freq: Once | INTRAVENOUS | Status: AC
  Administered 2017-08-30: 1000 mg via INTRAVENOUS
  Filled 2017-08-30: qty 200

## 2017-08-30 MED ORDER — ZOLPIDEM TARTRATE 5 MG PO TABS
5.0000 mg | ORAL_TABLET | Freq: Every evening | ORAL | Status: DC | PRN
Start: 2017-08-30 — End: 2017-09-02

## 2017-08-30 MED ORDER — ALBUTEROL SULFATE (2.5 MG/3ML) 0.083% IN NEBU
5.0000 mg | INHALATION_SOLUTION | Freq: Once | RESPIRATORY_TRACT | Status: AC
  Administered 2017-08-30: 5 mg via RESPIRATORY_TRACT
  Filled 2017-08-30: qty 6

## 2017-08-30 MED ORDER — PANTOPRAZOLE SODIUM 40 MG PO TBEC
40.0000 mg | DELAYED_RELEASE_TABLET | Freq: Every day | ORAL | Status: DC
  Filled 2017-08-30: qty 1

## 2017-08-30 MED ORDER — HEPARIN SODIUM (PORCINE) 5000 UNIT/ML IJ SOLN
5000.0000 [IU] | Freq: Three times a day (TID) | INTRAMUSCULAR | Status: DC
  Administered 2017-08-30 – 2017-08-31 (×4): 5000 [IU] via SUBCUTANEOUS
  Filled 2017-08-30 (×4): qty 1

## 2017-08-30 MED ORDER — ONDANSETRON HCL 4 MG/2ML IJ SOLN: 4.0000 mg | Freq: Four times a day (QID) | INTRAMUSCULAR | Status: DC | PRN

## 2017-08-30 MED ORDER — MORPHINE SULFATE (PF) 4 MG/ML IV SOLN
INTRAVENOUS | Status: AC
  Administered 2017-08-30: 3 mg via INTRAVENOUS
  Filled 2017-08-30: qty 1

## 2017-08-30 MED ORDER — SODIUM CHLORIDE 0.9 % IV BOLUS (SEPSIS)
1000.0000 mL | Freq: Once | INTRAVENOUS | Status: AC
  Administered 2017-08-30: 1000 mL via INTRAVENOUS

## 2017-08-30 MED ORDER — IOPAMIDOL (ISOVUE-370) INJECTION 76%
INTRAVENOUS | Status: AC
  Filled 2017-08-30: qty 100

## 2017-08-30 MED ORDER — INSULIN ASPART 100 UNIT/ML ~~LOC~~ SOLN
0.0000 [IU] | Freq: Three times a day (TID) | SUBCUTANEOUS | Status: DC
  Administered 2017-08-31: 3 [IU] via SUBCUTANEOUS
  Administered 2017-08-31 (×2): 5 [IU] via SUBCUTANEOUS

## 2017-08-30 MED ORDER — AMLODIPINE BESYLATE 10 MG PO TABS
10.0000 mg | ORAL_TABLET | Freq: Every day | ORAL | Status: DC
  Administered 2017-08-30 – 2017-08-31 (×2): 10 mg via ORAL
  Filled 2017-08-30 (×2): qty 2

## 2017-08-30 MED ORDER — METOCLOPRAMIDE HCL 5 MG/ML IJ SOLN
5.0000 mg | Freq: Once | INTRAMUSCULAR | Status: AC
  Administered 2017-08-30: 5 mg via INTRAVENOUS
  Filled 2017-08-30: qty 2

## 2017-08-30 MED ORDER — MUSCLE RUB 10-15 % EX CREA
1.0000 "application " | TOPICAL_CREAM | CUTANEOUS | Status: DC | PRN
  Administered 2017-08-31: 1 via TOPICAL
  Filled 2017-08-30: qty 85

## 2017-08-30 MED ORDER — TRAMADOL HCL 50 MG PO TABS
50.0000 mg | ORAL_TABLET | Freq: Four times a day (QID) | ORAL | Status: DC | PRN
  Administered 2017-08-30 – 2017-08-31 (×3): 50 mg via ORAL
  Filled 2017-08-30 (×3): qty 1

## 2017-08-30 MED ORDER — ACETAMINOPHEN 325 MG PO TABS
650.0000 mg | ORAL_TABLET | Freq: Four times a day (QID) | ORAL | Status: DC | PRN
  Administered 2017-08-31: 650 mg via ORAL
  Filled 2017-08-30: qty 2

## 2017-08-30 MED ORDER — MORPHINE SULFATE (PF) 4 MG/ML IV SOLN
4.0000 mg | Freq: Once | INTRAVENOUS | Status: AC
  Administered 2017-08-30: 4 mg via INTRAVENOUS
  Filled 2017-08-30: qty 1

## 2017-08-30 MED ORDER — CEFEPIME HCL 2 G IJ SOLR
2.0000 g | Freq: Once | INTRAMUSCULAR | Status: AC
  Administered 2017-08-30: 2 g via INTRAVENOUS
  Filled 2017-08-30: qty 2

## 2017-08-30 MED ORDER — HYDRALAZINE HCL 20 MG/ML IJ SOLN
10.0000 mg | Freq: Four times a day (QID) | INTRAMUSCULAR | Status: DC | PRN
  Administered 2017-08-31 – 2017-09-03 (×4): 10 mg via INTRAVENOUS
  Filled 2017-08-30 (×5): qty 1

## 2017-08-30 NOTE — ED Notes (Signed)
ED Provider at bedside. 

## 2017-08-30 NOTE — ED Notes (Signed)
Both sets blood cultures collected prior to IV abx

## 2017-08-30 NOTE — ED Provider Notes (Signed)
[facility] DEPT Provider Note   CSN: [id number] Arrival date & time: 09/28/2017  0919     History   Chief Complaint Chief Complaint  Patient presents with  . Shortness of Breath    HPI Noam Karaffa is a 83 y.o. male.  Complains of nonproductive cough and progressively worsening shortness of breath over the past 2 weeks.  No treatment prior to coming here.  No fever.  Complains of anterior chest pain, worse with coughing.  Also complains of nausea no other associated symptoms.  Nothing makes symptoms better or worse.  Feels improved since having received nebulized treatment on arrival here prior to my exam HPI  Past Medical History:  Diagnosis Date  . Anemia    PMH of  . Anisocoria    congenital  . Diabetes mellitus   . GERD (gastroesophageal reflux disease)   . Hyperlipidemia    mixed  . Other abnormal glucose   . Vitamin D deficiency   Lung cancer  Patient Active Problem List   Diagnosis Date Noted  . Right lower lobe lung mass 08/12/2017  . Essential hypertension 06/10/2017  . Routine general medical examination at a health care facility 11/07/2015  . History of bladder cancer 06/10/2014  . ANEMIA, MILD 08/03/2010  . Other nonspecific abnormal finding of lung field 08/03/2010  . Diet-controlled diabetes mellitus ([place]) 07/31/2010  . GERD 01/20/2010  . Vitamin D deficiency 03/17/2009  . Hyperlipidemia 03/17/2009  . NONSPECIFIC ABNORMAL ELECTROCARDIOGRAM 03/17/2009    Past Surgical History:  Procedure Laterality Date  . bladder resection for cancer  1998   Dr Hartley Barefoot  . CATARACT EXTRACTION    . COLONOSCOPY  2006 & 2012   Dr Lajoyce Corners  . PENILE PROSTHESIS IMPLANT  2011   Dr Hartley Barefoot       Home Medications    Prior to Admission medications   Medication Sig Start Date End Date Taking? Authorizing Provider  acetaminophen (TYLENOL) 500 MG tablet Take 500 mg by mouth every 6 (six) hours as needed for mild pain.   Yes [provider]  amLODipine (NORVASC) 10 MG tablet Take 1 tablet (10 mg total) by mouth daily. 06/15/17  Yes Hoyt Koch, MD  Menthol-Methyl Salicylate (MUSCLE RUB) 10-15 % CREA Apply 1 application topically as needed for muscle pain.   Yes [provider]  naproxen sodium (ALEVE) 220 MG tablet Take 220 mg by mouth daily as needed (pain).   Yes [provider]  traMADol (ULTRAM) 50 MG tablet Take 1-2 tablets (50-100 mg total) by mouth every 4 (four) hours as needed. 08/12/17  Yes Hoyt Koch, MD  zolpidem (AMBIEN) 5 MG tablet Take 1 tablet (5 mg total) by mouth at bedtime as needed for sleep. 06/15/17  Yes Hoyt Koch, MD    Family History Family History  Problem Relation Age of Onset  . Alcohol abuse Mother   . Stroke Mother 48  . COPD Father   . Heart disease Brother 75       stents  . Diabetes Neg Hx   . Cancer Neg Hx     Social History Social History   Tobacco Use  . Smoking status: Never Smoker  . Smokeless tobacco: Never Used  Substance Use Topics  . Alcohol use: Yes    Comment:  Rarely  . Drug use: No     Allergies   Sulfa drugs cross reactors   Review of Systems Review of Systems  Constitutional: Negative.  HENT: Negative.   Respiratory: Positive for cough and shortness of breath.   Cardiovascular: Negative.   Gastrointestinal: Negative.   Musculoskeletal: Negative.   Skin: Negative.   Allergic/Immunologic: Positive for immunocompromised state.       Cancer patient  Neurological: Negative.   Psychiatric/Behavioral: Negative.   All other systems reviewed and are negative.    Physical Exam Updated Vital Signs BP (!) 164/92 (BP Location: Right Arm)   Pulse (!) 130   Temp 98.2 F (36.8 C) (Rectal)   Resp (!) 28   Ht 5\' 6"  (1.676 m)   Wt 86.2 kg (190 lb)   SpO2 (!) 89%   BMI 30.67 kg/m   Physical Exam  Constitutional: He appears well-developed and well-nourished.  HENT:  Head: Normocephalic and  atraumatic.  Eyes: Conjunctivae are normal. Pupils are equal, round, and reactive to light.  Neck: Neck supple. No tracheal deviation present. No thyromegaly present.  Cardiovascular: Regular rhythm.  No murmur heard. Tachycardic  Pulmonary/Chest: Effort normal. He has wheezes.  Expiratory wheezes  Abdominal: Soft. Bowel sounds are normal. He exhibits no distension. There is no tenderness.  Musculoskeletal: Normal range of motion. He exhibits no edema or tenderness.  Neurological: He is alert. Coordination normal.  Skin: Skin is warm and dry. No rash noted.  Psychiatric: He has a normal mood and affect.  Nursing note and vitals reviewed.    ED Treatments / Results  Labs (all labs ordered are listed, but only abnormal results are displayed) Labs Reviewed  BASIC METABOLIC PANEL  CBC WITH DIFFERENTIAL/PLATELET    EKG  EKG Interpretation  Date/Time:  Tuesday August 30 2017 09:43:38 EST Ventricular Rate:  125 PR Interval:    QRS Duration: 79 QT Interval:  435 QTC Calculation: 628 R Axis:   42 Text Interpretation:  Sinus tachycardia Consider right atrial enlargement Minimal ST depression, lateral leads Prolonged QT interval Baseline wander in lead(s) V1 SINCE LAST TRACING HEART RATE HAS INCREASED Confirmed by Orlie Dakin [phone]) on 09/11/2017 10:56:11 AM     Chest x-ray reviewed by me Results for orders placed or performed during the hospital encounter of 60/10/93  Basic metabolic panel  Result Value Ref Range   Sodium 134 (L) 135 - 145 mmol/L   Potassium 3.7 3.5 - 5.1 mmol/L   Chloride 95 (L) 101 - 111 mmol/L   CO2 27 22 - 32 mmol/L   Glucose, Bld 186 (H) 65 - 99 mg/dL   BUN 16 6 - 20 mg/dL   Creatinine, Ser 0.87 0.61 - 1.24 mg/dL   Calcium 9.8 8.9 - 10.3 mg/dL   GFR calc non Af Amer >60 >60 mL/min   GFR calc Af Amer >60 >60 mL/min   Anion gap 12 5 - 15  CBC with Differential/Platelet  Result Value Ref Range   WBC 19.7 (H) 4.0 - 10.5 K/uL   RBC 4.60 4.22 - 5.81  MIL/uL   Hemoglobin 13.1 13.0 - 17.0 g/dL   HCT 39.5 39.0 - 52.0 %   MCV 85.9 78.0 - 100.0 fL   MCH 28.5 26.0 - 34.0 pg   MCHC 33.2 30.0 - 36.0 g/dL   RDW 14.2 11.5 - 15.5 %   Platelets 337 150 - 400 K/uL   Neutrophils Relative % 79 %   Neutro Abs 15.7 (H) 1.7 - 7.7 K/uL   Lymphocytes Relative 10 %   Lymphs Abs 1.9 0.7 - 4.0 K/uL   Monocytes Relative 10 %   Monocytes Absolute 1.9 (H) 0.1 -  1.0 K/uL   Eosinophils Relative 1 %   Eosinophils Absolute 0.1 0.0 - 0.7 K/uL   Basophils Relative 0 %   Basophils Absolute 0.0 0.0 - 0.1 K/uL  D-dimer, quantitative (not at [facility])  Result Value Ref Range   D-Dimer, Quant 3.41 (H) 0.00 - 0.50 ug/mL-FEU  Influenza panel by PCR (type A & B)  Result Value Ref Range   Influenza A By PCR NEGATIVE NEGATIVE   Influenza B By PCR NEGATIVE NEGATIVE  I-Stat CG4 Lactic Acid, ED  (not at  [facility])  Result Value Ref Range   Lactic Acid, Venous 2.79 (HH) 0.5 - 1.9 mmol/L   Comment NOTIFIED PHYSICIAN    Dg Chest 2 View  Result Date: 09/20/2017 CLINICAL DATA:  Lung cancer. EXAM: CHEST  2 VIEW COMPARISON:  04/04/2015 FINDINGS: Two-view chest shows right lower lobe pulmonary mass lesion with diffuse right middle lobe opacity and small right pleural effusion. The cardio pericardial silhouette is enlarged. Left lower lobe granuloma stable in the interval. There is pulmonary vascular congestion without overt pulmonary edema. The visualized bony structures of the thorax are intact. Telemetry leads overlie the chest. IMPRESSION: Right lower lobe pulmonary mass with probable right middle lobe atelectasis and small right pleural effusion. Imaging features compatible with the reported clinical history of lung cancer. Electronically Signed   By: Misty Stanley M.D.   On: 09/15/2017 11:02   Ct Angio Chest Pe W And/or Wo Contrast  Result Date: 09/08/2017 CLINICAL DATA:  Lung cancer.  Short of breath. EXAM: CT ANGIOGRAPHY CHEST WITH CONTRAST TECHNIQUE: Multidetector CT imaging of  the chest was performed using the standard protocol during bolus administration of intravenous contrast. Multiplanar CT image reconstructions and MIPs were obtained to evaluate the vascular anatomy. CONTRAST:  172mL ISOVUE-370 IOPAMIDOL (ISOVUE-370) INJECTION 76% COMPARISON:  07/31/2010 FINDINGS: Cardiovascular: There are no filling defects in the pulmonary arterial tree to suggest acute pulmonary thromboembolism. Atherosclerotic calcifications of the aortic arch are present. No evidence of aortic dissection or aneurysm. Great vessels are grossly patent. There is narrowing of several of the central right pulmonary arteries and veins secondary to malignancy. Mild 3 vessel coronary artery calcification. Mediastinum/Nodes: There is abnormal confluence soft tissue mass throughout the mediastinum worrisome for malignancy. 5.5 x 6.7 cm subcarinal mass extending into the precarinal and right hilar regions. This is also continuous with a 3.3 x 2.7 cm right peritracheal mass. 2.0 cm prevascular lymph node on image 27. 1.6 cm high right paratracheal lymph node. Other smaller scattered lymph nodes are noted. No pericardial effusion. Visualized thyroid is unremarkable. The esophagus extends through the subcarinal mass and becomes imperceptible. Malignant involvement of the esophagus cannot be excluded. See image 42 of series 4 abnormal soft tissue extends into the left hilar region. See image 42 of series 4 Lungs/Pleura: 4.9 x 3.6 cm mass in the superior segment right lower lobe is associated with surrounding patchy and interstitial opacities. Findings are worrisome for malignancy and possible lymphangitic spread of tumor. There is a second smaller more central right lower lobe lung mass measuring 1.5 cm on image 95 of series 6. Left lung is clear other than calcified granulomata and subsegmental atelectasis. There is an associated small right pleural effusion. Confluent mass extending from the mediastinum into the right hilum  causes severe narrowing of the right mainstem bronchus and right lung lobar airways. The right middle lobe airway is occluded and there is collapse of the right middle lobe. Upper Abdomen: No acute abnormality. Musculoskeletal: There is  a aggressive destructive lesion in the manubrium worrisome for metastatic skeletal involvement of malignancy. T12 mA angioma has a benign appearance. Review of the MIP images confirms the above findings. IMPRESSION: No evidence of pulmonary thromboembolism. Right lower lobe lung mass associated with mediastinal and bilateral hilar adenopathy. Mediastinal adenopathy is confluent, extends in of the right hilum, and causes narrowing of airways as well as pulmonary vessel branches. Right pleural effusion. Right middle lobe collapse secondary to right middle lobe airway occlusion. Possible lymphangitic tumor in the right lower lobe. Possible involvement of the midesophagus. 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 Biopsy should provide diagnostic material. Aortic Atherosclerosis (ICD10-I70.0). Electronically Signed   By: Marybelle Killings M.D.   On: 09/07/2017 13:10   Radiology No results found.  Procedures Procedures (including critical care time)  Medications Ordered in ED Medications  metoCLOPramide (REGLAN) injection 5 mg (not administered)  albuterol (PROVENTIL) (2.5 MG/3ML) 0.083% nebulizer solution 5 mg (5 mg Nebulization Given 09/26/2017 0944)     Initial Impression / Assessment and Plan / ED Course  I have reviewed the triage vital signs and the nursing notes.  Pertinent labs & imaging results that were available during my care of the patient were reviewed by me and considered in my medical decision making (see chart for details).     11:30 AM complains of diffuse body aches.  Influenza a panel ordered.  He remains tachypneic appears uncomfortable lungs with diffuse wheezes.  iv morphine ordered. Code sepsis called  based on sirs criteria of tachypnea, tachycardia.  1:30 PM patient complains of mild anterior chest pain, improved after treatment with IV morphine.  Requesting more pain medicine.  Additional IV morphine ordered.  Nausea has resolved after treatment with IV antiemetic  3:05 PM patient requesting additional pain medicine.  Additional intravenous morphine ordered Consulted Dr. Dyann Kief from hospitalist service will arrange for admission.  He will call palliative care consult in light of patient's extensive cancer  Pulse oximetry consistent with hypoxia.  Doubt infection or sepsis.  No infiltrate on CT chest.  Likely invasion of tumor causing dyspnea and hypoxia Final Clinical Impressions(s) / ED Diagnoses  Dx #1 hypoxia #2 lung cancer cancer #3 hyperglycemia #4 dyspnea #5 elevated blood pressure #6 leukocytosis Final diagnoses:  None   CRITICAL CARE Performed by: Orlie Dakin Total critical care time: 35 minutes Critical care time was exclusive of separately billable procedures and treating other patients. Critical care was necessary to treat or prevent imminent or life-threatening deterioration. Critical care was time spent personally by me on the following activities: development of treatment plan with patient and/or surrogate as well as nursing, discussions with consultants, evaluation of patient's response to treatment, examination of patient, obtaining history from patient or surrogate, ordering and performing treatments and interventions, ordering and review of laboratory studies, ordering and review of radiographic studies, pulse oximetry and re-evaluation of patient's condition. ED Discharge Orders    None       Orlie Dakin, MD 09/22/2017 1520

## 2017-08-30 NOTE — ED Notes (Signed)
ED TO INPATIENT HANDOFF REPORT  Name/Age/Gender Henry [name] 83 y.o. male  Code Status    Code Status Orders  (From admission, onward)        Start     Ordered   08/31/2017 1656  Full code  Continuous     09/20/2017 1658    Code Status History    Date Active Date Inactive Code Status Order ID Comments User Context   This patient has a current code status but no historical code status.      Home/SNF/Other Home  Chief Complaint Ca pt, SOB, Hasnt been eating, Cough  Level of Care/Admitting Diagnosis ED Disposition    ED Disposition Condition [facility] Area: [place] [[id number]]  Level of Care: Telemetry [5]  Admit to tele based on following criteria: Complex arrhythmia (Bradycardia/Tachycardia)  Diagnosis: Acute on chronic respiratory failure with hypoxia [facility]) [[id number]]  Admitting Physician: Barton Dubois [[id number]]  Attending Physician: Barton Dubois [[id number]]  Estimated length of stay: past midnight tomorrow  Certification:: I certify this patient will need inpatient services for at least 2 midnights  PT Class (Do Not Modify): Inpatient [101]  PT Acc Code (Do Not Modify): Private [1]       Medical History Past Medical History:  Diagnosis Date  . Anemia    PMH of  . Anisocoria    congenital  . Diabetes mellitus   . GERD (gastroesophageal reflux disease)   . Hyperlipidemia    mixed  . Other abnormal glucose   . Vitamin D deficiency     Allergies Allergies  Allergen Reactions  . Sulfa Drugs Cross Reactors     rash    IV Location/Drains/Wounds Patient Lines/Drains/Airways Status   Active Line/Drains/Airways    Name:   Placement date:   Placement time:   Site:   Days:   Peripheral IV 09/26/2017 Right Antecubital   09/25/2017    1106    Antecubital   less than 1          Labs/Imaging Results for orders placed or performed during the hospital encounter of 09/24/2017 (from the past 48 hour(s))  Basic metabolic panel      Status: Abnormal   Collection Time: 09/26/2017 11:02 AM  Result Value Ref Range   Sodium 134 (L) 135 - 145 mmol/L   Potassium 3.7 3.5 - 5.1 mmol/L   Chloride 95 (L) 101 - 111 mmol/L   CO2 27 22 - 32 mmol/L   Glucose, Bld 186 (H) 65 - 99 mg/dL   BUN 16 6 - 20 mg/dL   Creatinine, Ser 0.87 0.61 - 1.24 mg/dL   Calcium 9.8 8.9 - 10.3 mg/dL   GFR calc non Af Amer >60 >60 mL/min   GFR calc Af Amer >60 >60 mL/min    Comment: (NOTE) The eGFR has been calculated using the CKD EPI equation. This calculation has not been validated in all clinical situations. eGFR's persistently <60 mL/min signify possible Chronic Kidney Disease.    Anion gap 12 5 - 15  CBC with Differential/Platelet     Status: Abnormal   Collection Time: 09/01/2017 11:02 AM  Result Value Ref Range   WBC 19.7 (H) 4.0 - 10.5 K/uL   RBC 4.60 4.22 - 5.81 MIL/uL   Hemoglobin 13.1 13.0 - 17.0 g/dL   HCT 39.5 39.0 - 52.0 %   MCV 85.9 78.0 - 100.0 fL   MCH 28.5 26.0 - 34.0 pg   MCHC 33.2 30.0 - 36.0  g/dL   RDW 14.2 11.5 - 15.5 %   Platelets 337 150 - 400 K/uL   Neutrophils Relative % 79 %   Neutro Abs 15.7 (H) 1.7 - 7.7 K/uL   Lymphocytes Relative 10 %   Lymphs Abs 1.9 0.7 - 4.0 K/uL   Monocytes Relative 10 %   Monocytes Absolute 1.9 (H) 0.1 - 1.0 K/uL   Eosinophils Relative 1 %   Eosinophils Absolute 0.1 0.0 - 0.7 K/uL   Basophils Relative 0 %   Basophils Absolute 0.0 0.0 - 0.1 K/uL  D-dimer, quantitative (not at [facility])     Status: Abnormal   Collection Time: 09/11/2017 11:02 AM  Result Value Ref Range   D-Dimer, Quant 3.41 (H) 0.00 - 0.50 ug/mL-FEU    Comment: (NOTE) At the manufacturer cut-off of 0.50 ug/mL FEU, this assay has been documented to exclude PE with a sensitivity and negative predictive value of 97 to 99%.  At this time, this assay has not been approved by the FDA to exclude DVT/VTE. Results should be correlated with clinical presentation.   Influenza panel by PCR (type A & B)     Status: None   Collection  Time: 09/29/2017 11:39 AM  Result Value Ref Range   Influenza A By PCR NEGATIVE NEGATIVE   Influenza B By PCR NEGATIVE NEGATIVE    Comment: (NOTE) The Xpert Xpress Flu assay is intended as an aid in the diagnosis of  influenza and should not be used as a sole basis for treatment.  This  assay is FDA approved for nasopharyngeal swab specimens only. Nasal  washings and aspirates are unacceptable for Xpert Xpress Flu testing.   I-Stat CG4 Lactic Acid, ED  (not at  [facility])     Status: Abnormal   Collection Time: 09/14/2017 12:21 PM  Result Value Ref Range   Lactic Acid, Venous 2.79 (HH) 0.5 - 1.9 mmol/L   Comment NOTIFIED PHYSICIAN   I-Stat CG4 Lactic Acid, ED  (not at  [facility])     Status: Abnormal   Collection Time: 09/04/2017  3:25 PM  Result Value Ref Range   Lactic Acid, Venous 2.98 (HH) 0.5 - 1.9 mmol/L   Comment NOTIFIED PHYSICIAN    Dg Chest 2 View  Result Date: 09/28/2017 CLINICAL DATA:  Lung cancer. EXAM: CHEST  2 VIEW COMPARISON:  04/04/2015 FINDINGS: Two-view chest shows right lower lobe pulmonary mass lesion with diffuse right middle lobe opacity and small right pleural effusion. The cardio pericardial silhouette is enlarged. Left lower lobe granuloma stable in the interval. There is pulmonary vascular congestion without overt pulmonary edema. The visualized bony structures of the thorax are intact. Telemetry leads overlie the chest. IMPRESSION: Right lower lobe pulmonary mass with probable right middle lobe atelectasis and small right pleural effusion. Imaging features compatible with the reported clinical history of lung cancer. Electronically Signed   By: Misty Stanley M.D.   On: 09/26/2017 11:02   Ct Angio Chest Pe W And/or Wo Contrast  Result Date: 09/25/2017 CLINICAL DATA:  Lung cancer.  Short of breath. EXAM: CT ANGIOGRAPHY CHEST WITH CONTRAST TECHNIQUE: Multidetector CT imaging of the chest was performed using the standard protocol during bolus administration of intravenous contrast.  Multiplanar CT image reconstructions and MIPs were obtained to evaluate the vascular anatomy. CONTRAST:  130m ISOVUE-370 IOPAMIDOL (ISOVUE-370) INJECTION 76% COMPARISON:  07/31/2010 FINDINGS: Cardiovascular: There are no filling defects in the pulmonary arterial tree to suggest acute pulmonary thromboembolism. Atherosclerotic calcifications of the aortic arch are present.  No evidence of aortic dissection or aneurysm. Great vessels are grossly patent. There is narrowing of several of the central right pulmonary arteries and veins secondary to malignancy. Mild 3 vessel coronary artery calcification. Mediastinum/Nodes: There is abnormal confluence soft tissue mass throughout the mediastinum worrisome for malignancy. 5.5 x 6.7 cm subcarinal mass extending into the precarinal and right hilar regions. This is also continuous with a 3.3 x 2.7 cm right peritracheal mass. 2.0 cm prevascular lymph node on image 27. 1.6 cm high right paratracheal lymph node. Other smaller scattered lymph nodes are noted. No pericardial effusion. Visualized thyroid is unremarkable. The esophagus extends through the subcarinal mass and becomes imperceptible. Malignant involvement of the esophagus cannot be excluded. See image 42 of series 4 abnormal soft tissue extends into the left hilar region. See image 42 of series 4 Lungs/Pleura: 4.9 x 3.6 cm mass in the superior segment right lower lobe is associated with surrounding patchy and interstitial opacities. Findings are worrisome for malignancy and possible lymphangitic spread of tumor. There is a second smaller more central right lower lobe lung mass measuring 1.5 cm on image 95 of series 6. Left lung is clear other than calcified granulomata and subsegmental atelectasis. There is an associated small right pleural effusion. Confluent mass extending from the mediastinum into the right hilum causes severe narrowing of the right mainstem bronchus and right lung lobar airways. The right middle  lobe airway is occluded and there is collapse of the right middle lobe. Upper Abdomen: No acute abnormality. Musculoskeletal: There is a aggressive destructive lesion in the manubrium worrisome for metastatic skeletal involvement of malignancy. T12 mA angioma has a benign appearance. Review of the MIP images confirms the above findings. IMPRESSION: No evidence of pulmonary thromboembolism. Right lower lobe lung mass associated with mediastinal and bilateral hilar adenopathy. Mediastinal adenopathy is confluent, extends in of the right hilum, and causes narrowing of airways as well as pulmonary vessel branches. Right pleural effusion. Right middle lobe collapse secondary to right middle lobe airway occlusion. Possible lymphangitic tumor in the right lower lobe. Possible involvement of the midesophagus. 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 Biopsy should provide diagnostic material. Aortic Atherosclerosis (ICD10-I70.0). Electronically Signed   By: Marybelle Killings M.D.   On: 09/07/2017 13:10    Pending Labs Unresulted Labs (From admission, onward)   Start     Ordered   08/31/17 0500  Comprehensive metabolic panel  Tomorrow morning,   R     09/25/2017 1658   08/31/17 0500  CBC  Tomorrow morning,   R     09/29/2017 1658   09/22/2017 1653  CBC  (heparin)  Once,   R    Comments:  Baseline for heparin therapy IF NOT ALREADY DRAWN.  Notify MD if PLT < 100 K.    09/14/2017 1658   09/26/2017 1653  Creatinine, serum  (heparin)  Once,   R    Comments:  Baseline for heparin therapy IF NOT ALREADY DRAWN.    09/16/2017 1658   09/27/2017 1148  Blood Culture (routine x 2)  BLOOD CULTURE X 2,   STAT     09/13/2017 1147      Vitals/Pain Today's Vitals   09/04/2017 1430 09/29/2017 1500 09/25/2017 1532 09/29/2017 1634  BP: (!) 194/90 (!) 190/114 (!) 168/107 (!) 193/118  Pulse: (!) 128 (!) 136 (!) 129 (!) 127  Resp: 20 18 19  (!) 22  Temp:      TempSrc:  SpO2: 92% (!) 87% (!) 89%  92%  Weight:      Height:      PainSc:        Isolation Precautions No active isolations  Medications Medications  iopamidol (ISOVUE-370) 76 % injection (not administered)  acetaminophen (TYLENOL) tablet 650 mg (not administered)  amLODipine (NORVASC) tablet 10 mg (not administered)  MUSCLE RUB CREA 1 application (not administered)  zolpidem (AMBIEN) tablet 5 mg (not administered)  traMADol (ULTRAM) tablet 50 mg (not administered)  methylPREDNISolone sodium succinate (SOLU-MEDROL) 125 mg/2 mL injection 60 mg (not administered)  morphine 4 MG/ML injection 3 mg (not administered)  heparin injection 5,000 Units (not administered)  amoxicillin-clavulanate (AUGMENTIN) 875-125 MG per tablet 1 tablet (not administered)  ipratropium-albuterol (DUONEB) 0.5-2.5 (3) MG/3ML nebulizer solution 3 mL (not administered)  hydrALAZINE (APRESOLINE) injection 10 mg (not administered)  0.9 %  sodium chloride infusion (not administered)  ondansetron (ZOFRAN) tablet 4 mg (not administered)    Or  ondansetron (ZOFRAN) injection 4 mg (not administered)  albuterol (PROVENTIL) (2.5 MG/3ML) 0.083% nebulizer solution 5 mg (5 mg Nebulization Given 09/11/2017 0944)  metoCLOPramide (REGLAN) injection 5 mg (5 mg Intravenous Given 09/18/2017 1107)  morphine 4 MG/ML injection 4 mg (4 mg Intravenous Given 09/04/2017 1152)  ceFEPIme (MAXIPIME) 2 g in dextrose 5 % 50 mL IVPB (0 g Intravenous Stopped 09/26/2017 1250)  vancomycin (VANCOCIN) IVPB 1000 mg/200 mL premix (0 mg Intravenous Stopped 09/11/2017 1320)  iopamidol (ISOVUE-370) 76 % injection 100 mL (100 mLs Intravenous Contrast Given 09/21/2017 1230)  morphine 2 MG/ML injection 2 mg (2 mg Intravenous Given 09/15/2017 1345)  sodium chloride 0.9 % bolus 1,000 mL (0 mLs Intravenous Stopped 09/06/2017 1515)  morphine 2 MG/ML injection 2 mg (2 mg Intravenous Given 09/15/2017 1525)    Mobility ambulatory

## 2017-08-30 NOTE — ED Notes (Signed)
Hospitalist at bedside 

## 2017-08-30 NOTE — ED Notes (Signed)
Patient transported to X-ray 

## 2017-08-30 NOTE — ED Notes (Signed)
I gave critical I Stat result to MD Johny Blamer

## 2017-08-30 NOTE — ED Triage Notes (Signed)
Patient recently diagnosed with lung cancer. hasnt started any treatments yet. Patient increasingly gotten worse with SOB over the past week. Has cough but is non-productive.

## 2017-08-30 NOTE — Plan of Care (Signed)
Patient admitted to [facility] from Emergency Room in no acute distress.  BP remains elevated, Norvasc given.  HR in 120's (MD aware), oxygen saturation in the low 90's on 4 liters.  Patient states his pain is much improved after receiving morphine in the Emergency Room as is his shortness of breath.  No family at bedside at this time.  Patient self caths x years, urethral catheter tray provided for patient.

## 2017-08-30 NOTE — Progress Notes (Signed)
A consult was received from an ED physician for vancomycin and cefepime per pharmacy dosing.  The patient's profile has been reviewed for ht/wt/allergies/indication/available labs.   A one time order has been placed for vanc 1gm and cefepime 2gm.  Further antibiotics/pharmacy consults should be ordered by admitting physician if indicated.                       Thank you, Dolly Rias RPh 09/17/2017, 11:55 AM Pager [phone]

## 2017-08-30 NOTE — H&P (Signed)
History and Physical    Henry [name] AVW:[id number] DOB: 09/30/1934 DOA: 09/04/2017  PCP: Hoyt Koch, MD   I have briefly reviewed patients previous medical reports in [facility].  Patient coming from: home  Chief Complaint: Shortness of breath, chest discomfort  HPI: Henry [name] is a 83 year old male with past medical history significant for diabetes (diet-controlled), care, hyperlipidemia, hypertension COPD and newly diagnosed lung cancer; who presented to the emergency department secondary to worsening shortness of breath and chest pain.  Patient symptoms have been present for the last 2 weeks and worsening.  His chest pain is essentially localized on the right side of his chest, nonradiating, present at all times, and without anything making his symptoms better or worse.  Patient is scheduled to see oncology and pulmonologist as an outpatient to further decide his treatment and was looking to pursued biopsy for tissue information and a PET scan. He denies fever, chills, nausea, vomiting, headaches, abdominal pain, dysuria, focal weakness or any other complaints.  ED Course: IV fluids given, CT chest order and demonstrating extensive lung cancer with lymphangitic tumor spread and narrowing of his airways by obstruction.  Initially service/sepsis suspected given presentation with tachypnea, tachycardia and elevated lactic acidosis.  He received one dose of vancomycin and cefepime. TRH called to admit patient for further evaluation and treatment.   Review of Systems:  All other systems reviewed and apart from HPI, are negative.  Past Medical History:  Diagnosis Date  . Anemia    PMH of  . Anisocoria    congenital  . Diabetes mellitus   . GERD (gastroesophageal reflux disease)   . Hyperlipidemia    mixed  . Other abnormal glucose   . Vitamin D deficiency     Past Surgical History:  Procedure Laterality Date  . bladder resection for cancer  1998   Dr  Hartley Barefoot  . CATARACT EXTRACTION    . COLONOSCOPY  2006 & 2012   Dr Lajoyce Corners  . PENILE PROSTHESIS IMPLANT  2011   Dr Hartley Barefoot    Social History  reports that  has never smoked. he has never used smokeless tobacco. He reports that he drinks alcohol. He reports that he does not use drugs.  Allergies  Allergen Reactions  . Sulfa Drugs Cross Reactors     rash    Family History  Problem Relation Age of Onset  . Alcohol abuse Mother   . Stroke Mother 65  . COPD Father   . Heart disease Brother 75       stents  . Diabetes Neg Hx   . Cancer Neg Hx     Prior to Admission medications   Medication Sig Start Date End Date Taking? Authorizing Provider  acetaminophen (TYLENOL) 500 MG tablet Take 500 mg by mouth every 6 (six) hours as needed for mild pain.   Yes [provider]  amLODipine (NORVASC) 10 MG tablet Take 1 tablet (10 mg total) by mouth daily. 06/15/17  Yes Hoyt Koch, MD  Menthol-Methyl Salicylate (MUSCLE RUB) 10-15 % CREA Apply 1 application topically as needed for muscle pain.   Yes [provider]  naproxen sodium (ALEVE) 220 MG tablet Take 220 mg by mouth daily as needed (pain).   Yes [provider]  traMADol (ULTRAM) 50 MG tablet Take 1-2 tablets (50-100 mg total) by mouth every 4 (four) hours as needed. 08/12/17  Yes Hoyt Koch, MD  zolpidem (AMBIEN) 5 MG tablet Take 1 tablet (5 mg total)  by mouth at bedtime as needed for sleep. 06/15/17  Yes Hoyt Koch, MD    Physical Exam: Vitals:   09/12/2017 1430 09/04/2017 1500 09/12/2017 1532 09/04/2017 1634  BP: (!) 194/90 (!) 190/114 (!) 168/107 (!) 193/118  Pulse: (!) 128 (!) 136 (!) 129 (!) 127  Resp: 20 18 19  (!) 22  Temp:      TempSrc:      SpO2: 92% (!) 87% (!) 89% 92%  Weight:      Height:       Constitutional: Uncomfortable in appearance, no febrile.  Complaining of shortness of breath and discomfort in his right costal area.  Patient denies nausea, vomiting,  abdominal pain or any other complaints. Eyes: PERTLA, lids and conjunctivae normal, no icterus, no nystagmus ENMT: Mucous membranes slightly dry on exam.  Posterior pharynx clear of any exudate or lesions.  Neck: supple, no masses, no thyromegaly, no JVD Respiratory: Diffuse expiratory wheezing, positive rhonchi and tachypnea on exam.  No accessory muscles. Cardiovascular: Sinus tachycardia, no rubs, no gallops, no murmurs.  S1 & S2 heard,  2+ pedal pulses. No carotid bruits.  Abdomen: No distension, no tenderness, no masses palpated. No hepatosplenomegaly. Bowel sounds normal.  Musculoskeletal: no clubbing / cyanosis. No joint deformity upper and lower extremities. Good ROM, no contractures. Normal muscle tone.  Skin: no rashes, lesions, ulcers. No induration Neurologic: CN 2-12 grossly intact. Sensation intact, DTR normal. Strength 4/5 in all 4 limbs.  Psychiatric: Normal judgment and insight. Alert and oriented x 3. Normal mood.     Labs on Admission: I have personally reviewed following labs and imaging studies  CBC: Recent Labs  Lab 09/28/2017 1102  WBC 19.7*  NEUTROABS 15.7*  HGB 13.1  HCT 39.5  MCV 85.9  PLT 998   Basic Metabolic Panel: Recent Labs  Lab 09/17/2017 1102  NA 134*  K 3.7  CL 95*  CO2 27  GLUCOSE 186*  BUN 16  CREATININE 0.87  CALCIUM 9.8   Urine analysis:    Component Value Date/Time   COLORURINE yellow 08/13/2010 1049   APPEARANCEUR Cloudy 08/13/2010 1049   LABSPEC 1.010 08/13/2010 1049   PHURINE 6.0 08/13/2010 1049   HGBUR negative 08/13/2010 1049   BILIRUBINUR negative 08/13/2010 1049   UROBILINOGEN 0.2 08/13/2010 1049   NITRITE negative 08/13/2010 1049    Radiological Exams on Admission: Dg Chest 2 View  Result Date: 09/16/2017 CLINICAL DATA:  Lung cancer. EXAM: CHEST  2 VIEW COMPARISON:  04/04/2015 FINDINGS: Two-view chest shows right lower lobe pulmonary mass lesion with diffuse right middle lobe opacity and small right pleural effusion.  The cardio pericardial silhouette is enlarged. Left lower lobe granuloma stable in the interval. There is pulmonary vascular congestion without overt pulmonary edema. The visualized bony structures of the thorax are intact. Telemetry leads overlie the chest. IMPRESSION: Right lower lobe pulmonary mass with probable right middle lobe atelectasis and small right pleural effusion. Imaging features compatible with the reported clinical history of lung cancer. Electronically Signed   By: Misty Stanley M.D.   On: 09/04/2017 11:02   Ct Angio Chest Pe W And/or Wo Contrast  Result Date: 09/28/2017 CLINICAL DATA:  Lung cancer.  Short of breath. EXAM: CT ANGIOGRAPHY CHEST WITH CONTRAST TECHNIQUE: Multidetector CT imaging of the chest was performed using the standard protocol during bolus administration of intravenous contrast. Multiplanar CT image reconstructions and MIPs were obtained to evaluate the vascular anatomy. CONTRAST:  165mL ISOVUE-370 IOPAMIDOL (ISOVUE-370) INJECTION 76% COMPARISON:  07/31/2010 FINDINGS:  Cardiovascular: There are no filling defects in the pulmonary arterial tree to suggest acute pulmonary thromboembolism. Atherosclerotic calcifications of the aortic arch are present. No evidence of aortic dissection or aneurysm. Great vessels are grossly patent. There is narrowing of several of the central right pulmonary arteries and veins secondary to malignancy. Mild 3 vessel coronary artery calcification. Mediastinum/Nodes: There is abnormal confluence soft tissue mass throughout the mediastinum worrisome for malignancy. 5.5 x 6.7 cm subcarinal mass extending into the precarinal and right hilar regions. This is also continuous with a 3.3 x 2.7 cm right peritracheal mass. 2.0 cm prevascular lymph node on image 27. 1.6 cm high right paratracheal lymph node. Other smaller scattered lymph nodes are noted. No pericardial effusion. Visualized thyroid is unremarkable. The esophagus extends through the subcarinal  mass and becomes imperceptible. Malignant involvement of the esophagus cannot be excluded. See image 42 of series 4 abnormal soft tissue extends into the left hilar region. See image 42 of series 4 Lungs/Pleura: 4.9 x 3.6 cm mass in the superior segment right lower lobe is associated with surrounding patchy and interstitial opacities. Findings are worrisome for malignancy and possible lymphangitic spread of tumor. There is a second smaller more central right lower lobe lung mass measuring 1.5 cm on image 95 of series 6. Left lung is clear other than calcified granulomata and subsegmental atelectasis. There is an associated small right pleural effusion. Confluent mass extending from the mediastinum into the right hilum causes severe narrowing of the right mainstem bronchus and right lung lobar airways. The right middle lobe airway is occluded and there is collapse of the right middle lobe. Upper Abdomen: No acute abnormality. Musculoskeletal: There is a aggressive destructive lesion in the manubrium worrisome for metastatic skeletal involvement of malignancy. T12 mA angioma has a benign appearance. Review of the MIP images confirms the above findings. IMPRESSION: No evidence of pulmonary thromboembolism. Right lower lobe lung mass associated with mediastinal and bilateral hilar adenopathy. Mediastinal adenopathy is confluent, extends in of the right hilum, and causes narrowing of airways as well as pulmonary vessel branches. Right pleural effusion. Right middle lobe collapse secondary to right middle lobe airway occlusion. Possible lymphangitic tumor in the right lower lobe. Possible involvement of the midesophagus. 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 Biopsy should provide diagnostic material. Aortic Atherosclerosis (ICD10-I70.0). Electronically Signed   By: Marybelle Killings M.D.   On: 08/31/2017 13:10    EKG:  Sinus tachycardia, no acute ischemic  changes.  Assessment/Plan 1-acute respiratory failure with hypoxia ([place]): With what appears to be secondary to COPD exacerbation with component of postobstructive pneumonia and further progression of lung cancer with narrowing of his airways and lymphangitic tumor spread. -Patient was pending to be seen by oncology and pulmonologist to further decide treatment (PET scan and bronchoscopy with biopsy to be done in the next coming weeks). -Patient having a significant amount of time secondary to pain, shortness of breath, wheezing and now requiring oxygen supplementation as well. -He denies fever but is present chills and his WBCs are elevated along with also lactic acid. -We will initiate treatment with steroids, DuoNeb, Augmentin for potential postobstructive pneumonia, oxygen supplementation and supportive care. -Will request thoracentesis with fluid analysis -once discussed with PCCM/oncology and base on further improvement and stabilization; biopsy by IR for tissue specimen might be able to be pursuit.  2-Diet-controlled diabetes mellitus ([place]) -will use SSI -modified carb diet   3-Hyperlipidemia -continue heart healthy diet   4-GERD -continue  PPI  5-Essential hypertension -currently elevated and most likely associated with pain -will resume home norvasc -heart healthy diet  -PRN hydralazine   6-insomnia -will use PRN zolpidem   7-dehydration -will give gentle IVF's resuscitation   8-goals of care discussion  -patient will like to meet with palliative care after discussing with PCCM and oncology. -full code for now   Time: 70 minutes    DVT prophylaxis: Heparin Code Status: Full code Family Communication: Wife and Grandson at bedside. Disposition Plan: To be determined.  Consults called: Oncology service (Dr. Earlie Server made aware of admission), PCCM of palliative care service. Admission status: Inpatient, length of stay more than 2 midnights, telemetry bed.   Barton Dubois MD [facility] Pager [phone]  If 7PM-7AM, please contact night-coverage [email or web address] Password [email or web address]  09/11/2017, 4:59 PM

## 2017-08-31 ENCOUNTER — Inpatient Hospital Stay (HOSPITAL_COMMUNITY): Payer: Medicare Other

## 2017-08-31 DIAGNOSIS — R609 Edema, unspecified: Secondary | ICD-10-CM

## 2017-08-31 DIAGNOSIS — J9601 Acute respiratory failure with hypoxia: Secondary | ICD-10-CM

## 2017-08-31 DIAGNOSIS — C3431 Malignant neoplasm of lower lobe, right bronchus or lung: Principal | ICD-10-CM

## 2017-08-31 DIAGNOSIS — R918 Other nonspecific abnormal finding of lung field: Secondary | ICD-10-CM

## 2017-08-31 DIAGNOSIS — R590 Localized enlarged lymph nodes: Secondary | ICD-10-CM

## 2017-08-31 DIAGNOSIS — I10 Essential (primary) hypertension: Secondary | ICD-10-CM

## 2017-08-31 DIAGNOSIS — Z7189 Other specified counseling: Secondary | ICD-10-CM

## 2017-08-31 LAB — COMPREHENSIVE METABOLIC PANEL
ALBUMIN: 3.3 g/dL — AB (ref 3.5–5.0)
ALK PHOS: 151 U/L — AB (ref 38–126)
ALT: 17 U/L (ref 17–63)
ANION GAP: 12 (ref 5–15)
AST: 21 U/L (ref 15–41)
BILIRUBIN TOTAL: 0.9 mg/dL (ref 0.3–1.2)
BUN: 17 mg/dL (ref 6–20)
CALCIUM: 9.5 mg/dL (ref 8.9–10.3)
CO2: 25 mmol/L (ref 22–32)
Chloride: 97 mmol/L — ABNORMAL LOW (ref 101–111)
Creatinine, Ser: 0.81 mg/dL (ref 0.61–1.24)
GFR calc Af Amer: >60 mL/min (ref >60)
GFR calc non Af Amer: >60 mL/min (ref >60)
GLUCOSE: 203 mg/dL — AB (ref 65–99)
Potassium: 4.5 mmol/L (ref 3.5–5.1)
SODIUM: 134 mmol/L — AB (ref 135–145)
Total Protein: 6.5 g/dL (ref 6.5–8.1)

## 2017-08-31 LAB — BLOOD GAS, ARTERIAL
ACID-BASE EXCESS: 2.4 mmol/L — AB (ref 0.0–2.0)
Acid-Base Excess: 0.1 mmol/L (ref 0.0–2.0)
Bicarbonate: 25.2 mmol/L (ref 20.0–28.0)
Bicarbonate: 26.1 mmol/L (ref 20.0–28.0)
DRAWN BY: 331471
DRAWN BY: 270211
FIO2: 1
MECHVT: 500 mL
O2 Content: 6 L/min
O2 SAT: 98.7 %
O2 Saturation: 87.9 %
PATIENT TEMPERATURE: 98.6
PATIENT TEMPERATURE: 98.6
PCO2 ART: 38.8 mmHg (ref 32.0–48.0)
PEEP/CPAP: 5 cmH2O
PH ART: 7.369 (ref 7.350–7.450)
PH ART: 7.443 (ref 7.350–7.450)
RATE: 24 resp/min
pCO2 arterial: 44.8 mmHg (ref 32.0–48.0)
pO2, Arterial: 60.2 mmHg — ABNORMAL LOW (ref 83.0–108.0)
pO2, Arterial: 131 mmHg — ABNORMAL HIGH (ref 83.0–108.0)

## 2017-08-31 LAB — GLUCOSE, CAPILLARY
GLUCOSE-CAPILLARY: 248 mg/dL — AB (ref 65–99)
GLUCOSE-CAPILLARY: 228 mg/dL — AB (ref 65–99)
GLUCOSE-CAPILLARY: 155 mg/dL — AB (ref 65–99)
Glucose-Capillary: 162 mg/dL — ABNORMAL HIGH (ref 65–99)
Glucose-Capillary: 195 mg/dL — ABNORMAL HIGH (ref 65–99)

## 2017-08-31 LAB — RESPIRATORY PANEL BY PCR
ADENOVIRUS-RVPPCR: NOT DETECTED
BORDETELLA PERTUSSIS-RVPCR: NOT DETECTED
CORONAVIRUS 229E-RVPPCR: NOT DETECTED
CORONAVIRUS HKU1-RVPPCR: NOT DETECTED
CORONAVIRUS NL63-RVPPCR: NOT DETECTED
CORONAVIRUS OC43-RVPPCR: NOT DETECTED
Chlamydophila pneumoniae: NOT DETECTED
Influenza A: NOT DETECTED
Influenza B: NOT DETECTED
METAPNEUMOVIRUS-RVPPCR: NOT DETECTED
Mycoplasma pneumoniae: NOT DETECTED
PARAINFLUENZA VIRUS 1-RVPPCR: NOT DETECTED
PARAINFLUENZA VIRUS 2-RVPPCR: NOT DETECTED
PARAINFLUENZA VIRUS 3-RVPPCR: NOT DETECTED
Parainfluenza Virus 4: NOT DETECTED
Respiratory Syncytial Virus: NOT DETECTED
Rhinovirus / Enterovirus: NOT DETECTED

## 2017-08-31 LAB — ECHOCARDIOGRAM COMPLETE
Height: 66 in
Weight: 2938.29 oz

## 2017-08-31 LAB — CBC
HCT: 37.2 % — ABNORMAL LOW (ref 39.0–52.0)
HEMOGLOBIN: 12.3 g/dL — AB (ref 13.0–17.0)
MCH: 28.6 pg (ref 26.0–34.0)
MCHC: 33.1 g/dL (ref 30.0–36.0)
MCV: 86.5 fL (ref 78.0–100.0)
Platelets: 293 10*3/uL (ref 150–400)
RBC: 4.3 MIL/uL (ref 4.22–5.81)
RDW: 14.4 % (ref 11.5–15.5)
WBC: 19.4 10*3/uL — ABNORMAL HIGH (ref 4.0–10.5)

## 2017-08-31 LAB — LACTIC ACID, PLASMA: Lactic Acid, Venous: 2.1 mmol/L (ref 0.5–1.9)

## 2017-08-31 LAB — PROCALCITONIN: PROCALCITONIN: 0.19 ng/mL

## 2017-08-31 LAB — TRIGLYCERIDES: TRIGLYCERIDES: 246 mg/dL — AB (ref <150)

## 2017-08-31 LAB — MRSA PCR SCREENING: MRSA by PCR: NEGATIVE

## 2017-08-31 MED ORDER — INSULIN ASPART 100 UNIT/ML ~~LOC~~ SOLN
2.0000 [IU] | SUBCUTANEOUS | Status: DC
  Administered 2017-08-31 – 2017-09-01 (×4): 4 [IU] via SUBCUTANEOUS
  Administered 2017-09-01: 6 [IU] via SUBCUTANEOUS

## 2017-08-31 MED ORDER — PROPOFOL 1000 MG/100ML IV EMUL
0.0000 ug/kg/min | INTRAVENOUS | Status: DC
  Administered 2017-08-31: 5 ug/kg/min via INTRAVENOUS
  Administered 2017-09-01: 40 ug/kg/min via INTRAVENOUS
  Administered 2017-09-01: 50 ug/kg/min via INTRAVENOUS
  Administered 2017-09-01: 35 ug/kg/min via INTRAVENOUS
  Administered 2017-09-01 (×2): 40 ug/kg/min via INTRAVENOUS
  Administered 2017-09-01: 35 ug/kg/min via INTRAVENOUS
  Administered 2017-09-02: 40 ug/kg/min via INTRAVENOUS
  Filled 2017-08-31 (×9): qty 100

## 2017-08-31 MED ORDER — IPRATROPIUM-ALBUTEROL 0.5-2.5 (3) MG/3ML IN SOLN
3.0000 mL | RESPIRATORY_TRACT | Status: DC
  Administered 2017-08-31 – 2017-09-09 (×49): 3 mL via RESPIRATORY_TRACT
  Filled 2017-08-31 (×50): qty 3

## 2017-08-31 MED ORDER — ORAL CARE MOUTH RINSE
15.0000 mL | Freq: Four times a day (QID) | OROMUCOSAL | Status: DC
  Administered 2017-09-01 – 2017-09-03 (×11): 15 mL via OROMUCOSAL

## 2017-08-31 MED ORDER — SODIUM CHLORIDE 0.9 % IV BOLUS (SEPSIS)
500.0000 mL | Freq: Once | INTRAVENOUS | Status: AC
  Administered 2017-08-31: 500 mL via INTRAVENOUS

## 2017-08-31 MED ORDER — FENTANYL CITRATE (PF) 100 MCG/2ML IJ SOLN
INTRAMUSCULAR | Status: AC
  Filled 2017-08-31: qty 2

## 2017-08-31 MED ORDER — MIDAZOLAM HCL 2 MG/2ML IJ SOLN
INTRAMUSCULAR | Status: AC
  Filled 2017-08-31: qty 2

## 2017-08-31 MED ORDER — DEXTROSE-NACL 5-0.45 % IV SOLN
INTRAVENOUS | Status: DC
  Administered 2017-08-31: 19:00:00 via INTRAVENOUS

## 2017-08-31 MED ORDER — PHENYLEPHRINE HCL-NACL 10-0.9 MG/250ML-% IV SOLN
INTRAVENOUS | Status: AC
  Administered 2017-08-31: 5 ug/min via INTRAVENOUS
  Filled 2017-08-31: qty 250

## 2017-08-31 MED ORDER — LIDOCAINE HCL 1 % IJ SOLN
INTRAMUSCULAR | Status: AC
  Filled 2017-08-31: qty 10

## 2017-08-31 MED ORDER — ETOMIDATE 2 MG/ML IV SOLN
20.0000 mg | Freq: Once | INTRAVENOUS | Status: AC
  Administered 2017-08-31: 20 mg via INTRAVENOUS

## 2017-08-31 MED ORDER — DEXTROSE 5 % IV SOLN
1.0000 g | Freq: Three times a day (TID) | INTRAVENOUS | Status: DC
  Administered 2017-09-01 – 2017-09-06 (×16): 1 g via INTRAVENOUS
  Filled 2017-08-31 (×18): qty 1

## 2017-08-31 MED ORDER — PANTOPRAZOLE SODIUM 40 MG PO TBEC
40.0000 mg | DELAYED_RELEASE_TABLET | Freq: Two times a day (BID) | ORAL | Status: DC
  Administered 2017-09-01 (×2): 40 mg via ORAL
  Filled 2017-08-31 (×3): qty 1

## 2017-08-31 MED ORDER — CHLORHEXIDINE GLUCONATE 0.12% ORAL RINSE (MEDLINE KIT)
15.0000 mL | Freq: Two times a day (BID) | OROMUCOSAL | Status: DC
  Administered 2017-08-31 – 2017-09-03 (×7): 15 mL via OROMUCOSAL

## 2017-08-31 MED ORDER — FENTANYL CITRATE (PF) 100 MCG/2ML IJ SOLN
25.0000 ug | INTRAMUSCULAR | Status: DC | PRN
  Administered 2017-08-31: 25 ug via INTRAVENOUS
  Administered 2017-09-01 (×4): 50 ug via INTRAVENOUS
  Administered 2017-09-02 (×2): 100 ug via INTRAVENOUS
  Filled 2017-08-31 (×7): qty 2

## 2017-08-31 MED ORDER — MIDAZOLAM HCL 2 MG/2ML IJ SOLN
2.0000 mg | Freq: Once | INTRAMUSCULAR | Status: AC
  Administered 2017-08-31: 2 mg via INTRAVENOUS

## 2017-08-31 MED ORDER — SODIUM BICARBONATE 8.4 % IV SOLN
50.0000 meq | Freq: Once | INTRAVENOUS | Status: AC
  Administered 2017-08-31: 50 meq via INTRAVENOUS

## 2017-08-31 MED ORDER — FENTANYL CITRATE (PF) 100 MCG/2ML IJ SOLN
100.0000 ug | Freq: Once | INTRAMUSCULAR | Status: AC
  Administered 2017-08-31: 100 ug via INTRAVENOUS

## 2017-08-31 MED ORDER — PHENYLEPHRINE HCL-NACL 10-0.9 MG/250ML-% IV SOLN
0.0000 ug/min | INTRAVENOUS | Status: DC
  Administered 2017-08-31: 5 ug/min via INTRAVENOUS

## 2017-08-31 MED ORDER — PERFLUTREN LIPID MICROSPHERE
1.0000 mL | INTRAVENOUS | Status: AC | PRN
  Administered 2017-08-31: 2 mL via INTRAVENOUS
  Filled 2017-08-31: qty 10

## 2017-08-31 MED ORDER — ROCURONIUM BROMIDE 50 MG/5ML IV SOLN
70.0000 mg | Freq: Once | INTRAVENOUS | Status: AC
  Administered 2017-08-31: 70 mg via INTRAVENOUS

## 2017-08-31 NOTE — Consult Note (Signed)
Consultation Note Date: 08/31/2017   Patient Name: Henry [name]  DOB: [birth year]  MRN: [id number]  Age / Sex: 83 y.o., male  PCP: Henry Koch, MD Referring Physician: Brand Males, MD  Reason for Consultation: Establishing goals of care  HPI/Patient Profile: 83 y.o. male  with past medical history of diabetes (diet-controlled), care, hyperlipidemia, hypertension COPD and newly diagnosed lung cancer admitted on 09/22/2017 with worsening SOB and chest discomfort. Patient had plans to see oncology and pulmonology to purse biopsy and PET scan. He has continued to decline since hospitalized with worsening respiratory status. Palliative care consulted for Henry [name].   Clinical Assessment and Goals of Care: I met today briefly with Henry [name] but he has just transferred to ICU and placed on BiPAP. He continues with very labored breathing even with BiPAP support.   I spoke more with his wife privately. I introduced myself and palliative care. She shares with me that Henry [name] seemed to be doing quite well up until just recently. They took a road trip to [place] to visit his brother just at Thanksgiving 2018. She says that since that trip his health continues to significantly decline. She questions if he was feeling bad for longer and just didn't share this with her. She is obviously very concerned with his continued acute decline.   We further discussed his current status and I expressed my concern that he would require intubation. I discussed further with his wife their conversations regarding life support to this point. She currently has his Living Will with her in which he indicates no prolonging measures with incurable illness, comatose state, and advanced dementia. However, his cancer has not been fully worked up and even more importantly he has expressed desire for full code recently. He has also expressed  fear to his wife that everything would not be done to keep him alive and was fearful for her to bring his Living Will. For these reasons we discussed that intubation would align with his recent wishes. She also plans to advise his son to come from [place] state given his decline.   All questions/concerns addressed. Emotional support provided. PCCM in route to evaluate patient who will likely require intubation. I will continue to follow and support wife and patient.   Primary Decision Maker PATIENT and then wife is HCPOA    SUMMARY OF RECOMMENDATIONS   - Full code - Will continue to follow - Wife would not want her husband to linger on life support without any signs of improvement or progress  Code Status/Advance Care Planning:  Full code   Symptom Management:   Per primary and PCCM.   Palliative Prophylaxis:   Aspiration, Delirium Protocol, Frequent Pain Assessment, Oral Care and Turn Reposition  Additional Recommendations (Limitations, Scope, Preferences):  Full Scope Treatment  Psycho-social/Spiritual:   Desire for further Chaplaincy support:no  Additional Recommendations: Caregiving  Support/Resources  Prognosis:   Unable to determine - however concern for very poor prognosis with no acute reversible process identified for acute decline  Discharge Planning:  To Be Determined      Primary Diagnoses: Present on Admission: . Hyperlipidemia . GERD . Essential hypertension . Lung cancer ([place]) . Acute respiratory failure with hypoxia ([place]) . Acute on chronic respiratory failure with hypoxia ([place])   I have reviewed the medical record, interviewed the patient and family, and examined the patient. The following aspects are pertinent.  Past Medical History:  Diagnosis Date  . Anemia    PMH of  . Anisocoria    congenital  . Diabetes mellitus   . GERD (gastroesophageal reflux disease)   . Hyperlipidemia    mixed  . Other abnormal glucose   . Vitamin D  deficiency    Social History   Socioeconomic History  . Marital status: Married    Spouse name: None  . Number of children: None  . Years of education: None  . Highest education level: None  Social Needs  . Financial resource strain: None  . Food insecurity - worry: None  . Food insecurity - inability: None  . Transportation needs - medical: None  . Transportation needs - non-medical: None  Occupational History  . None  Tobacco Use  . Smoking status: Never Smoker  . Smokeless tobacco: Never Used  Substance and Sexual Activity  . Alcohol use: Yes    Comment:  Rarely  . Drug use: No  . Sexual activity: None  Other Topics Concern  . None  Social History Narrative  . None   Family History  Problem Relation Age of Onset  . Alcohol abuse Mother   . Stroke Mother 59  . COPD Father   . Heart disease Brother 75       stents  . Diabetes Neg Hx   . Cancer Neg Hx    Scheduled Meds: . heparin  5,000 Units Subcutaneous Q8H  . insulin aspart  2-6 Units Subcutaneous Q4H  . ipratropium-albuterol  3 mL Nebulization Q4H  . methylPREDNISolone (SOLU-MEDROL) injection  60 mg Intravenous Q8H  . pantoprazole  40 mg Oral BID AC   Continuous Infusions: . ceFEPime (MAXIPIME) IV    . dextrose 5 % and 0.45% NaCl    . phenylephrine (NEO-SYNEPHRINE) Adult infusion Stopped (08/31/17 1836)  . propofol (DIPRIVAN) infusion    . sodium chloride 500 mL (08/31/17 1836)   PRN Meds:.hydrALAZINE, MUSCLE RUB, ondansetron **OR** ondansetron (ZOFRAN) IV, perflutren lipid microspheres (DEFINITY) IV suspension, zolpidem Allergies  Allergen Reactions  . Sulfa Drugs Cross Reactors     rash   Review of Systems  Unable to perform ROS: Acuity of condition    Physical Exam  Constitutional: He appears well-developed. He has a sickly appearance. He appears distressed.  HENT:  Head: Normocephalic and atraumatic.  Cardiovascular: Regular rhythm. Tachycardia present.  Pulmonary/Chest: Accessory muscle  usage present. Tachypnea noted. He is in respiratory distress.  On BiPAP, likely to require intubation  Abdominal: Soft.  Neurological: He is alert.  Mostly oriented but did not fully assess d/t resp distress  Nursing note and vitals reviewed.   Vital Signs: BP (!) 188/86 (BP Location: Left Arm)   Pulse (!) 129   Temp 98.2 F (36.8 C) (Oral)   Resp 18   Ht 5' 6"  (1.676 m)   Wt 83.3 kg (183 lb 10.3 oz)   SpO2 96%   BMI 29.64 kg/m  Pain Assessment: 0-10   Pain Score: Asleep   SpO2: SpO2: 96 % O2 Device:SpO2: 96 % O2 Flow Rate: .O2 Flow Rate (L/min): 6 L/min  IO: Intake/output  summary:   Intake/Output Summary (Last 24 hours) at 08/31/2017 1851 Last data filed at 08/31/2017 1700 Gross per 24 hour  Intake 1560 ml  Output 490 ml  Net 1070 ml    LBM: Last BM Date: 08/29/17 Baseline Weight: Weight: 86.2 kg (190 lb) Most recent weight: Weight: 83.3 kg (183 lb 10.3 oz)     Palliative Assessment/Data: 20%    Time Total: 70 min  Greater than 50%  of this time was spent counseling and coordinating care related to the above assessment and plan.  Signed by: Vinie Sill, NP Palliative Medicine Team Pager # [phone] (M-F 8a-5p) Team Phone # [phone] (Nights/Weekends)

## 2017-08-31 NOTE — Significant Event (Signed)
Rapid Response Event Note  Overview: Time Called: 1441 Arrival Time: 1446 Event Type: Respiratory Called by Bedside RN in regards to patient having labored breathing, will possibly need Bipap according to CCM MD. Upon arrival, patient alert and oriented but having increased work of breathing. Patient on 6 Liters [place], O2 Sats 88-89%.  Initial Focused Assessment: Neuro: Patient alert and oriented x 4, pain associated with WOB, able to follow commands, purposeful movements in all extremities, pupils 2-3 equal and reactive to light. Cardiac: S1, S2 heart sounds heard upon auscultation, patient tachycardic 120s-130s (periods of ventricular [place]), pulses 2-3+ bounding in radial and pedal pulses bilateral, patient also hypertensive-see vital signs Pulmonary: Breath Sounds rhonchus in all lung fields, patient utilizing some abdominal muscles to breathe, RR 17-18, 6LNC, O2 88-89%.  Interventions: Bedside RN notified MD Ramaswamy in regards to ABG results, MD ordered to place patient on BIPAP  Bedside RN gave pain medication ordered (3mg  of Morphine) Placed patient on NRB  Trx to SDU for BIPAP  Plan of Care (if not transferred):  Event Summary:   at      at          [facility] C

## 2017-08-31 NOTE — Progress Notes (Signed)
Pt tachypneic, resp 22-24 with accessory breathing, pt sat on 6 l  88-89%---Morphine 3 mg given, Pt alert. Wife at bedside, MD updated on chest xray and LA, Bipap ordrred, RR at bedside, pt transferred to 1224--Report to [facility]. SRP, RN

## 2017-08-31 NOTE — Procedures (Signed)
Bronchoscopy Procedure Note Henry [name] [id number] 1935-02-15  Procedure: Bronchoscopy Indications: respiratory distress and eval of airway post itntubation - done 2nd after intubation  Procedure Details Consent: Risks of procedure as well as the alternatives and risks of each were explained to the (patient/caregiver).  Consent for procedure obtained. Time Out: Verified patient identification, verified procedure, site/side was marked, verified correct patient position, special equipment/implants available, medications/allergies/relevent history reviewed, required imaging and test results available.  Performed  In preparation for procedure, patient was given 100% FiO2. Sedation: Muscle relaxants and Etomidate  Airway entered and the following bronchi were examined: Bronchi.  - MASSIVE SUBCARINAL MASS. NO FUNGATION. WAs difficult to enter right side but left side could be negotiated Procedures performed: Brushings performed Bronchoscope removed.    Evaluation Hemodynamic Status: BP stable throughout; O2 sats: stable throughout Patient's Current Condition: stable Specimens:  None Complications: No apparent complications Patient did tolerate procedure well.   Henry [name] 08/31/2017

## 2017-08-31 NOTE — Progress Notes (Signed)
Pharmacy Antibiotic Note  Henry [name] is a 83 y.o. male admitted on 09/17/2017 with shortness of breath.  Pharmacy has been consulted for cefepime dosing.  Plan: Cefepime 1g IV q8h No further dosing adjustments needed at this time, pharmacy will sign-off but will follow patient along with CCM  Height: 5\' 6"  (167.6 cm) Weight: 183 lb 10.3 oz (83.3 kg) IBW/kg (Calculated) : 63.8  Temp (24hrs), Avg:98.5 F (36.9 C), Min:98.2 F (36.8 C), Max:98.7 F (37.1 C)  Recent Labs  Lab 09/18/2017 1102 09/02/2017 1221 09/26/2017 1525 08/31/17 0417 08/31/17 1349  WBC 19.7*  --   --  19.4*  --   CREATININE 0.87  --   --  0.81  --   LATICACIDVEN  --  2.79* 2.98*  --  2.1*    Estimated Creatinine Clearance: 73.7 mL/min (by C-G formula based on SCr of 0.81 mg/dL).    Allergies  Allergen Reactions  . Sulfa Drugs Cross Reactors     rash    Antimicrobials this admission:  1/1 Vanc/Cefepime x 1 1/1 Augmentin >> 1/2 1/2 Cefepime >>  Dose adjustments this admission:    Microbiology results:  1/1 BCx: ngtd 1/1 Influenza panel: A/B neg 1/2 MRSA PCR: neg 1/2 Respiratory panel: sent  Thank you for allowing pharmacy to be a part of this patient's care.  Peggyann Juba, PharmD, BCPS Pager: [phone] 08/31/2017 6:01 PM

## 2017-08-31 NOTE — Progress Notes (Signed)
Initial Nutrition Assessment  DOCUMENTATION CODES:   Not applicable  INTERVENTION:   Ensure Enlive po BID (pt prefers vanilla), each supplement provides 350 kcal and 20 grams of protein  NUTRITION DIAGNOSIS:   Increased nutrient needs related to cancer and cancer related treatments, catabolic illness as evidenced by estimated needs.  GOAL:   Patient will meet greater than or equal to 90% of their needs  MONITOR:   PO intake, Supplement acceptance, Weight trends, I & O's  REASON FOR ASSESSMENT:   Malnutrition Screening Tool    ASSESSMENT:   Pt with PMH of DM, GERD, HLD, HTN, and newly diagnosed lung cancer presents with chest pain and SOB found acute respiratory failure with hypoxia    Pt did not elaborate on RD questions at time of visit. Pt endorses a poor appetite r/t his newly diagnosed cancer. Reports no nutrition impact symptoms.  Pt reports his weight is stable  Pt reports enjoying vanilla Boost at home. RD offered vanilla Ensure, pt amenable to supplementation.  Pt reports he has not eaten anything since admission.   Labs reviewed; Na 134, Chloride 97 Medications reviewed; SSI, Solu-Medrol, Protonix, NS @ 75 ml/hr   NUTRITION - FOCUSED PHYSICAL EXAM:    Most Recent Value  Orbital Region  No depletion  Upper Arm Region  No depletion  Thoracic and Lumbar Region  No depletion  Buccal Region  No depletion  Temple Region  No depletion  Clavicle Bone Region  No depletion  Clavicle and Acromion Bone Region  No depletion  Scapular Bone Region  No depletion  Dorsal Hand  No depletion  Patellar Region  Mild depletion  Anterior Thigh Region  Mild depletion  Posterior Calf Region  No depletion  Edema (RD Assessment)  None      Diet Order:  Diet NPO time specified Except for: Sips with Meds  EDUCATION NEEDS:   No education needs have been identified at this time  Skin:  Skin Assessment: Reviewed RN Assessment  Last BM:  08/29/17  Height:   Ht Readings  from Last 1 Encounters:  09/12/2017 5\' 6"  (1.676 m)    Weight:   Wt Readings from Last 1 Encounters:  09/07/2017 183 lb 10.3 oz (83.3 kg)    Ideal Body Weight:  64.5 kg  BMI:  Body mass index is 29.64 kg/m.  Estimated Nutritional Needs:   Kcal:  1800-2000  Protein:  100-110 grams  Fluid:  >/= 1.8 L/d  Parks Ranger, MS, RDN, LDN 08/31/2017 3:29 PM

## 2017-08-31 NOTE — Progress Notes (Signed)
LE venous duplex prelim: negative for DVT. Oluwaseyi Tull Eunice, RDMS, RVT  

## 2017-08-31 NOTE — Progress Notes (Signed)
Patient Demographics:    Henry [name], is a 83 y.o. male, DOB - 10/02/1934, IRW:[id number]  Admit date - 09/08/2017   Admitting Physician Barton Dubois, MD  Outpatient Primary MD for the patient is Hoyt Koch, MD  LOS - 1   Chief Complaint  Patient presents with  . Shortness of Breath        Subjective:    Henry [name] today has no fevers, no emesis,   shortness of breath and chest discomfort got worse as the day went on, patient and his wife both confirm that he is a full code, discussed with Dr. Chase Caller, patient failed BiPAP and was subsequently intubated, post intubation bronchoscopy demonstrates massive subcarinal mass, echocardiogram with preserved EF over 60%, venous Dopplers without acute DVT  Assessment  & Plan :    Principal Problem:   Acute respiratory failure with hypoxia ([place]) Active Problems:   Diet-controlled diabetes mellitus ([place])   Hyperlipidemia   GERD   Essential hypertension   Lung mass   Lung cancer ([place])   Acute on chronic respiratory failure with hypoxia ([place])   Mediastinal adenopathy  Brief summary: Admitted on 09/04/2017 with lung mass,  shortness of breath and chest discomfort got worse as the day went on, patient and his wife both confirm that he is a full code, discussed with Dr. Chase Caller, patient failed BiPAP and was subsequently intubated on 08/31/17, post intubation bronchoscopy demonstrates massive subcarinal mass, echocardiogram with preserved EF over 60%, venous Dopplers without acute DVT  1)Acute respiratory failure with hypoxia -  lung cancer with narrowing of his airways and lymphangitic tumor spread,  discussed with Dr. Chase Caller, patient failed BiPAP and was subsequently intubated on 08/31/17, post intubation bronchoscopy demonstrates massive subcarinal mass.   2-Diet-controlled diabetes mellitus - Use Novolog/Humalog Sliding scale insulin with  Accu-Cheks/Fingersticks as ordered  3)Social/Ethics-patient had previously requested full CODE STATUS, his wife concurs, get palliative care consult to help establish goals of care      DVT prophylaxis: Heparin Code Status: Full code Family Communication: Wife   at bedside. Disposition Plan: To be determined.  Consults called: Oncology service (Dr. Earlie Server made aware of admission), PCCM of palliative care service. Admission status: Inpatient, length of stay more than 2 midnights, ICU    Lab Results  Component Value Date   PLT 293 08/31/2017    Inpatient Medications  Scheduled Meds: . heparin  5,000 Units Subcutaneous Q8H  . insulin aspart  2-6 Units Subcutaneous Q4H  . ipratropium-albuterol  3 mL Nebulization Q4H  . methylPREDNISolone (SOLU-MEDROL) injection  60 mg Intravenous Q8H  . pantoprazole  40 mg Oral BID AC   Continuous Infusions: . ceFEPime (MAXIPIME) IV    . dextrose 5 % and 0.45% NaCl 50 mL/hr at 08/31/17 1907  . phenylephrine (NEO-SYNEPHRINE) Adult infusion Stopped (08/31/17 1836)  . propofol (DIPRIVAN) infusion 5 mcg/kg/min (08/31/17 1906)   PRN Meds:.hydrALAZINE, MUSCLE RUB, ondansetron **OR** ondansetron (ZOFRAN) IV, zolpidem    Anti-infectives (From admission, onward)   Start     Dose/Rate Route Frequency Ordered Stop   08/31/17 1800  ceFEPIme (MAXIPIME) 1 g in dextrose 5 % 50 mL IVPB     1 g 100 mL/hr over 30 Minutes Intravenous Every 8 hours 08/31/17 1753  09/17/2017 2200  amoxicillin-clavulanate (AUGMENTIN) 875-125 MG per tablet 1 tablet  Status:  Discontinued     1 tablet Oral Every 12 hours 09/24/2017 1658 08/31/17 1745   09/26/2017 1200  ceFEPIme (MAXIPIME) 2 g in dextrose 5 % 50 mL IVPB     2 g 100 mL/hr over 30 Minutes Intravenous  Once 09/25/2017 1147 09/07/2017 1250   09/17/2017 1200  vancomycin (VANCOCIN) IVPB 1000 mg/200 mL premix     1,000 mg 200 mL/hr over 60 Minutes Intravenous  Once 09/28/2017 1147 09/04/2017 1320        Objective:    Vitals:   08/31/17 1518 08/31/17 1525 08/31/17 1712 08/31/17 1800  BP:   (!) 172/131 121/65  Pulse:   (!) 127 (!) 122  Resp:   16 14  Temp:      TempSrc:      SpO2: 94% 96% 97% 99%  Weight:      Height:    5\' 6"  (1.676 m)    Wt Readings from Last 3 Encounters:  09/29/2017 83.3 kg (183 lb 10.3 oz)  08/19/17 86.2 kg (190 lb)  06/15/17 89.4 kg (197 lb)     Intake/Output Summary (Last 24 hours) at 08/31/2017 1919 Last data filed at 08/31/2017 1700 Gross per 24 hour  Intake 1560 ml  Output 490 ml  Net 1070 ml     Physical Exam  Gen:- Awake, alert with conversational dyspnea earlier, subsequently intubated and sedated HEENT- ET tube Lungs-diminished  breath sounds  CV- S1, S2 normal Abd-  +ve B.Sounds, Abd Soft, No tenderness,    Extremity/Skin:- No  edema,    Neuro-no new focal deficits prior to intubation  Psych-affect was anxious prior to intubation    Data Review:   Micro Results Recent Results (from the past 240 hour(s))  Blood Culture (routine x 2)     Status: None (Preliminary result)   Collection Time: 09/20/2017 12:15 PM  Result Value Ref Range Status   Specimen Description BLOOD RIGHT HAND  Final   Special Requests   Final    BOTTLES DRAWN AEROBIC AND ANAEROBIC Blood Culture adequate volume   Culture   Final    NO GROWTH < 24 HOURS Performed at [facility], [place] [address] [place], [place] [address]    Report Status PENDING  Incomplete  Blood Culture (routine x 2)     Status: None (Preliminary result)   Collection Time: 09/06/2017 12:22 PM  Result Value Ref Range Status   Specimen Description BLOOD LEFT HAND  Final   Special Requests   Final    BOTTLES DRAWN AEROBIC AND ANAEROBIC Blood Culture results may not be optimal due to an excessive volume of blood received in culture bottles   Culture   Final    NO GROWTH < 24 HOURS Performed at [facility], [place] [address], [place], [place] [address]    Report Status PENDING  Incomplete  MRSA PCR  Screening     Status: None   Collection Time: 08/31/17  4:20 PM  Result Value Ref Range Status   MRSA by PCR NEGATIVE NEGATIVE Final    Comment:        The GeneXpert MRSA Assay (FDA approved for NASAL specimens only), is one component of a comprehensive MRSA colonization surveillance program. It is not intended to diagnose MRSA infection nor to guide or monitor treatment for MRSA infections.     Radiology Reports Dg Chest 2 View  Result Date: 09/01/2017 CLINICAL  DATA:  Lung cancer. EXAM: CHEST  2 VIEW COMPARISON:  04/04/2015 FINDINGS: Two-view chest shows right lower lobe pulmonary mass lesion with diffuse right middle lobe opacity and small right pleural effusion. The cardio pericardial silhouette is enlarged. Left lower lobe granuloma stable in the interval. There is pulmonary vascular congestion without overt pulmonary edema. The visualized bony structures of the thorax are intact. Telemetry leads overlie the chest. IMPRESSION: Right lower lobe pulmonary mass with probable right middle lobe atelectasis and small right pleural effusion. Imaging features compatible with the reported clinical history of lung cancer. Electronically Signed   By: Misty Stanley M.D.   On: 09/08/2017 11:02   Dg Abd 1 View  Result Date: 08/31/2017 CLINICAL DATA:  OG tube placement. EXAM: ABDOMEN - 1 VIEW COMPARISON:  None. FINDINGS: OG tube appears adequately positioned in the stomach. Proximal side holes are approximately 5 cm below the gastroesophageal junction. Visualized bowel gas pattern is nonobstructive. IMPRESSION: Enteric tube appears adequately positioned in the stomach. Electronically Signed   By: Franki Cabot M.D.   On: 08/31/2017 19:14   Ct Angio Chest Pe W And/or Wo Contrast  Result Date: 09/03/2017 CLINICAL DATA:  Lung cancer.  Short of breath. EXAM: CT ANGIOGRAPHY CHEST WITH CONTRAST TECHNIQUE: Multidetector CT imaging of the chest was performed using the standard protocol during bolus  administration of intravenous contrast. Multiplanar CT image reconstructions and MIPs were obtained to evaluate the vascular anatomy. CONTRAST:  172mL ISOVUE-370 IOPAMIDOL (ISOVUE-370) INJECTION 76% COMPARISON:  07/31/2010 FINDINGS: Cardiovascular: There are no filling defects in the pulmonary arterial tree to suggest acute pulmonary thromboembolism. Atherosclerotic calcifications of the aortic arch are present. No evidence of aortic dissection or aneurysm. Great vessels are grossly patent. There is narrowing of several of the central right pulmonary arteries and veins secondary to malignancy. Mild 3 vessel coronary artery calcification. Mediastinum/Nodes: There is abnormal confluence soft tissue mass throughout the mediastinum worrisome for malignancy. 5.5 x 6.7 cm subcarinal mass extending into the precarinal and right hilar regions. This is also continuous with a 3.3 x 2.7 cm right peritracheal mass. 2.0 cm prevascular lymph node on image 27. 1.6 cm high right paratracheal lymph node. Other smaller scattered lymph nodes are noted. No pericardial effusion. Visualized thyroid is unremarkable. The esophagus extends through the subcarinal mass and becomes imperceptible. Malignant involvement of the esophagus cannot be excluded. See image 42 of series 4 abnormal soft tissue extends into the left hilar region. See image 42 of series 4 Lungs/Pleura: 4.9 x 3.6 cm mass in the superior segment right lower lobe is associated with surrounding patchy and interstitial opacities. Findings are worrisome for malignancy and possible lymphangitic spread of tumor. There is a second smaller more central right lower lobe lung mass measuring 1.5 cm on image 95 of series 6. Left lung is clear other than calcified granulomata and subsegmental atelectasis. There is an associated small right pleural effusion. Confluent mass extending from the mediastinum into the right hilum causes severe narrowing of the right mainstem bronchus and right  lung lobar airways. The right middle lobe airway is occluded and there is collapse of the right middle lobe. Upper Abdomen: No acute abnormality. Musculoskeletal: There is a aggressive destructive lesion in the manubrium worrisome for metastatic skeletal involvement of malignancy. T12 mA angioma has a benign appearance. Review of the MIP images confirms the above findings. IMPRESSION: No evidence of pulmonary thromboembolism. Right lower lobe lung mass associated with mediastinal and bilateral hilar adenopathy. Mediastinal adenopathy is confluent, extends in  of the right hilum, and causes narrowing of airways as well as pulmonary vessel branches. Right pleural effusion. Right middle lobe collapse secondary to right middle lobe airway occlusion. Possible lymphangitic tumor in the right lower lobe. Possible involvement of the midesophagus. 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 Biopsy should provide diagnostic material. Aortic Atherosclerosis (ICD10-I70.0). Electronically Signed   By: Marybelle Killings M.D.   On: 09/07/2017 13:10   [place] Chest (pleural Effusion)  Result Date: 08/31/2017 CLINICAL DATA:  SMALL RIGHT PLEURAL EFFUSION EXAM: CHEST ULTRASOUND COMPARISON:  09/28/2017 FINDINGS: Ultrasound performed the right chest with the patient right side up decubitus. Small right effusion noted. There is not enough fluid to warrant thoracentesis for therapeutic purposes. Procedure not performed. IMPRESSION: Small right pleural effusion.  Thoracentesis not performed. Electronically Signed   By: Jerilynn Mages.  Shick M.D.   On: 08/31/2017 10:55   Dg Chest Port 1 View  Result Date: 08/31/2017 CLINICAL DATA:  Endotracheal tube EXAM: PORTABLE CHEST 1 VIEW COMPARISON:  Chest x-ray from earlier same day. Chest CT dated 09/12/2017. FINDINGS: Endotracheal tube appears well positioned with tip approximately 3 cm above the carina. Heart size and mediastinal contours are stable. Right  perihilar/infrahilar mass is stable in the short-term interval. No new lung findings. No pneumothorax seen. IMPRESSION: Endotracheal tube appears well positioned with tip approximately 3 cm above the carina. No other interval change compared to today's earlier chest x-ray and CT findings. Electronically Signed   By: Franki Cabot M.D.   On: 08/31/2017 19:14   Dg Chest Port 1 View  Result Date: 08/31/2017 CLINICAL DATA:  Respiratory distress.  History of lung cancer. EXAM: PORTABLE CHEST 1 VIEW COMPARISON:  Chest radiographs and CT 09/15/2017 FINDINGS: The cardiac silhouette is normal in size. Right paratracheal and hilar density corresponds to lymphadenopathy demonstrated on CT. Medial right lung base opacity is unchanged from the recent radiographs and corresponds to the known right lower lobe mass and right middle lobe collapse. A calcified granuloma is again noted in the left lung, which is otherwise clear. No large pleural effusion or pneumothorax is identified. IMPRESSION: Unchanged appearance of the chest. Persistent right middle lobe collapse in the setting of right lower lobe mass and lymphadenopathy. Electronically Signed   By: Logan Bores M.D.   On: 08/31/2017 14:14     CBC Recent Labs  Lab 09/20/2017 1102 08/31/17 0417  WBC 19.7* 19.4*  HGB 13.1 12.3*  HCT 39.5 37.2*  PLT 337 293  MCV 85.9 86.5  MCH 28.5 28.6  MCHC 33.2 33.1  RDW 14.2 14.4  LYMPHSABS 1.9  --   MONOABS 1.9*  --   EOSABS 0.1  --   BASOSABS 0.0  --     Chemistries  Recent Labs  Lab 09/01/2017 1102 08/31/17 0417  NA 134* 134*  K 3.7 4.5  CL 95* 97*  CO2 27 25  GLUCOSE 186* 203*  BUN 16 17  CREATININE 0.87 0.81  CALCIUM 9.8 9.5  AST  --  21  ALT  --  17  ALKPHOS  --  151*  BILITOT  --  0.9   ------------------------------------------------------------------------------------------------------------------ No results for input(s): CHOL, HDL, LDLCALC, TRIG, CHOLHDL, LDLDIRECT in the last 72 hours.  Lab  Results  Component Value Date   HGBA1C 6.7 (H) 09/20/2017   ------------------------------------------------------------------------------------------------------------------ No results for input(s): TSH, T4TOTAL, T3FREE, THYROIDAB in the last 72 hours.  Invalid input(s): FREET3 ------------------------------------------------------------------------------------------------------------------ No results for input(s): VITAMINB12, FOLATE, FERRITIN, TIBC, IRON, RETICCTPCT  in the last 72 hours.  Coagulation profile No results for input(s): INR, PROTIME in the last 168 hours.  Recent Labs    09/03/2017 1102  DDIMER 3.41*    Cardiac Enzymes No results for input(s): CKMB, TROPONINI, MYOGLOBIN in the last 168 hours.  Invalid input(s): CK ------------------------------------------------------------------------------------------------------------------ No results found for: BNP   Roxan Hockey M.D on 08/31/2017 at 7:19 PM  Between 7am to 7pm - Pager - [phone]  After 7pm go to [email or web address] - password [email or web address]  [facility] -  Office  [phone]   Voice Recognition Viviann Spare dictation system was used to create this note, attempts have been made to correct errors. Please contact the author with questions and/or clarifications.

## 2017-08-31 NOTE — Consult Note (Signed)
Urology Consult   Physician requesting consult: Dr. Marchelle Gearing  Reason for consult: Bladder management  History of Present Illness: Henry [name] is a 83 y.o. patient previously followed by Dr. Patsi Sears and now followed by Dr. Annabell Howells.  He has a history of bladder cancer s/p radical cystectomy and [place] pouch urinary diversion.  He catheterizes his pouch 4 times daily with a 14 Fr catheter using CIC technique.  He has not had problems catheterizing.  He is now admitted to the hospital with respiratory distress and a lung mass and is currently not able to perform self CIC.    Past Medical History:  Diagnosis Date  . Anemia    PMH of  . Anisocoria    congenital  . Diabetes mellitus   . GERD (gastroesophageal reflux disease)   . Hyperlipidemia    mixed  . Other abnormal glucose   . Vitamin D deficiency   Bladder cancer   Past Surgical History:  Procedure Laterality Date  . bladder resection for cancer  1998   Dr Marcello Fennel  . CATARACT EXTRACTION    . COLONOSCOPY  2006 & 2012   Dr Virginia Rochester  . PENILE PROSTHESIS IMPLANT  2011   Dr Marcello Fennel  Removal of IPP in 2012.  Current Hospital Medications:  Home Meds:  Current Meds  Medication Sig  . acetaminophen (TYLENOL) 500 MG tablet Take 500 mg by mouth every 6 (six) hours as needed for mild pain.  Marland Kitchen amLODipine (NORVASC) 10 MG tablet Take 1 tablet (10 mg total) by mouth daily.  . Menthol-Methyl Salicylate (MUSCLE RUB) 10-15 % CREA Apply 1 application topically as needed for muscle pain.  . naproxen sodium (ALEVE) 220 MG tablet Take 220 mg by mouth daily as needed (pain).  . traMADol (ULTRAM) 50 MG tablet Take 1-2 tablets (50-100 mg total) by mouth every 4 (four) hours as needed.  . zolpidem (AMBIEN) 5 MG tablet Take 1 tablet (5 mg total) by mouth at bedtime as needed for sleep.    Scheduled Meds: . heparin  5,000 Units Subcutaneous Q8H  . insulin aspart  2-6 Units Subcutaneous Q4H  . ipratropium-albuterol  3 mL Nebulization Q4H   . methylPREDNISolone (SOLU-MEDROL) injection  60 mg Intravenous Q8H  . pantoprazole  40 mg Oral BID AC   Continuous Infusions: . ceFEPime (MAXIPIME) IV    . dextrose 5 % and 0.45% NaCl 50 mL/hr at 08/31/17 1907  . phenylephrine (NEO-SYNEPHRINE) Adult infusion Stopped (08/31/17 1836)  . propofol (DIPRIVAN) infusion 5 mcg/kg/min (08/31/17 1906)   PRN Meds:.hydrALAZINE, MUSCLE RUB, ondansetron **OR** ondansetron (ZOFRAN) IV, zolpidem  Allergies:  Allergies  Allergen Reactions  . Sulfa Drugs Cross Reactors     rash    Family History  Problem Relation Age of Onset  . Alcohol abuse Mother   . Stroke Mother 49  . COPD Father   . Heart disease Brother 75       stents  . Diabetes Neg Hx   . Cancer Neg Hx     Social History:  reports that  has never smoked. he has never used smokeless tobacco. He reports that he drinks alcohol. He reports that he does not use drugs.  ROS: A complete review of systems was performed.  All systems are negative except for pertinent findings as noted.  Physical Exam:  Vital signs in last 24 hours: Temp:  [98.2 F (36.8 C)-98.7 F (37.1 C)] 98.2 F (36.8 C) (01/02 0550) Pulse Rate:  [122-129] 122 (01/02 1800) Resp:  [14-22]  14 (01/02 1800) BP: (121-191)/(65-131) 121/65 (01/02 1800) SpO2:  [88 %-99 %] 99 % (01/02 1800) FiO2 (%):  [50 %-100 %] 100 % (01/02 1819) Constitutional:  Alert and oriented, in obvious respiratory distress on BIPAP Cardiovascular: Regular rate and rhythm Respiratory: Accessory muscle use for respiration GI: Abdomen is soft, nontender, nondistended, no abdominal masses, he has a continent catheterizable channel in his RLQ GU: No CVA tenderness Lymphatic: No lymphadenopathy Neurologic: Grossly intact, no focal deficits Psychiatric: Normal mood and affect  Laboratory Data:  Recent Labs    09/04/2017 1102 08/31/17 0417  WBC 19.7* 19.4*  HGB 13.1 12.3*  HCT 39.5 37.2*  PLT 337 293    Recent Labs    09/26/2017 1102  08/31/17 0417  NA 134* 134*  K 3.7 4.5  CL 95* 97*  GLUCOSE 186* 203*  BUN 16 17  CALCIUM 9.8 9.5  CREATININE 0.87 0.81     Results for orders placed or performed during the hospital encounter of 09/25/2017 (from the past 24 hour(s))  Glucose, capillary     Status: Abnormal   Collection Time: 09/15/2017 10:39 PM  Result Value Ref Range   Glucose-Capillary 184 (H) 65 - 99 mg/dL  Comprehensive metabolic panel     Status: Abnormal   Collection Time: 08/31/17  4:17 AM  Result Value Ref Range   Sodium 134 (L) 135 - 145 mmol/L   Potassium 4.5 3.5 - 5.1 mmol/L   Chloride 97 (L) 101 - 111 mmol/L   CO2 25 22 - 32 mmol/L   Glucose, Bld 203 (H) 65 - 99 mg/dL   BUN 17 6 - 20 mg/dL   Creatinine, Ser 1.61 0.61 - 1.24 mg/dL   Calcium 9.5 8.9 - 09.6 mg/dL   Total Protein 6.5 6.5 - 8.1 g/dL   Albumin 3.3 (L) 3.5 - 5.0 g/dL   AST 21 15 - 41 U/L   ALT 17 17 - 63 U/L   Alkaline Phosphatase 151 (H) 38 - 126 U/L   Total Bilirubin 0.9 0.3 - 1.2 mg/dL   GFR calc non Af Amer >60 >60 mL/min   GFR calc Af Amer >60 >60 mL/min   Anion gap 12 5 - 15  CBC     Status: Abnormal   Collection Time: 08/31/17  4:17 AM  Result Value Ref Range   WBC 19.4 (H) 4.0 - 10.5 K/uL   RBC 4.30 4.22 - 5.81 MIL/uL   Hemoglobin 12.3 (L) 13.0 - 17.0 g/dL   HCT 04.5 (L) 40.9 - 81.1 %   MCV 86.5 78.0 - 100.0 fL   MCH 28.6 26.0 - 34.0 pg   MCHC 33.1 30.0 - 36.0 g/dL   RDW 91.4 78.2 - 95.6 %   Platelets 293 150 - 400 K/uL  Glucose, capillary     Status: Abnormal   Collection Time: 08/31/17  7:33 AM  Result Value Ref Range   Glucose-Capillary 248 (H) 65 - 99 mg/dL  Glucose, capillary     Status: Abnormal   Collection Time: 08/31/17 11:50 AM  Result Value Ref Range   Glucose-Capillary 228 (H) 65 - 99 mg/dL  Lactic acid, plasma     Status: Abnormal   Collection Time: 08/31/17  1:49 PM  Result Value Ref Range   Lactic Acid, Venous 2.1 (HH) 0.5 - 1.9 mmol/L  Procalcitonin - Baseline     Status: None   Collection Time:  08/31/17  1:49 PM  Result Value Ref Range   Procalcitonin 0.19 ng/mL  Blood  gas, arterial     Status: Abnormal   Collection Time: 08/31/17  2:25 PM  Result Value Ref Range   O2 Content 6.0 L/min   Delivery systems NASAL CANNULA    pH, Arterial 7.369 7.350 - 7.450   pCO2 arterial 44.8 32.0 - 48.0 mmHg   pO2, Arterial 60.2 (L) 83.0 - 108.0 mmHg   Bicarbonate 25.2 20.0 - 28.0 mmol/L   Acid-Base Excess 0.1 0.0 - 2.0 mmol/L   O2 Saturation 87.9 %   Patient temperature 98.6    Collection site RIGHT RADIAL    Drawn by [id number]    Sample type ARTERIAL DRAW    Allens test (pass/fail) PASS PASS  MRSA PCR Screening     Status: None   Collection Time: 08/31/17  4:20 PM  Result Value Ref Range   MRSA by PCR NEGATIVE NEGATIVE  Glucose, capillary     Status: Abnormal   Collection Time: 08/31/17  4:33 PM  Result Value Ref Range   Glucose-Capillary 162 (H) 65 - 99 mg/dL  Blood gas, arterial     Status: Abnormal   Collection Time: 08/31/17  7:05 PM  Result Value Ref Range   FIO2 1.00    Delivery systems VENTILATOR    Mode PRESSURE REGULATED VOLUME CONTROL    VT 500 mL   LHR 24 resp/min   Peep/cpap 5.0 cm H20   pH, Arterial 7.443 7.350 - 7.450   pCO2 arterial 38.8 32.0 - 48.0 mmHg   pO2, Arterial 131 (H) 83.0 - 108.0 mmHg   Bicarbonate 26.1 20.0 - 28.0 mmol/L   Acid-Base Excess 2.4 (H) 0.0 - 2.0 mmol/L   O2 Saturation 98.7 %   Patient temperature 98.6    Collection site RIGHT RADIAL    Drawn by [id number]    Sample type ARTERIAL DRAW    Allens test (pass/fail) PASS PASS   Recent Results (from the past 240 hour(s))  Blood Culture (routine x 2)     Status: None (Preliminary result)   Collection Time: 09-15-17 12:15 PM  Result Value Ref Range Status   Specimen Description BLOOD RIGHT HAND  Final   Special Requests   Final    BOTTLES DRAWN AEROBIC AND ANAEROBIC Blood Culture adequate volume   Culture   Final    NO GROWTH < 24 HOURS Performed at [facility], [address]  [place], [place] [address]    Report Status PENDING  Incomplete  Blood Culture (routine x 2)     Status: None (Preliminary result)   Collection Time: 09-15-2017 12:22 PM  Result Value Ref Range Status   Specimen Description BLOOD LEFT HAND  Final   Special Requests   Final    BOTTLES DRAWN AEROBIC AND ANAEROBIC Blood Culture results may not be optimal due to an excessive volume of blood received in culture bottles   Culture   Final    NO GROWTH < 24 HOURS Performed at [facility], [address], [place], [place] [address]    Report Status PENDING  Incomplete  MRSA PCR Screening     Status: None   Collection Time: 08/31/17  4:20 PM  Result Value Ref Range Status   MRSA by PCR NEGATIVE NEGATIVE Final    Comment:        The GeneXpert MRSA Assay (FDA approved for NASAL specimens only), is one component of a comprehensive MRSA colonization surveillance program. It is not intended to diagnose MRSA infection nor to guide or monitor  treatment for MRSA infections.     Renal Function: Recent Labs    09/17/2017 1102 08/31/17 0417  CREATININE 0.87 0.81   Estimated Creatinine Clearance: 73.7 mL/min (by C-G formula based on SCr of 0.81 mg/dL).  Radiologic Imaging: Dg Chest 2 View  Result Date: 09/18/2017 CLINICAL DATA:  Lung cancer. EXAM: CHEST  2 VIEW COMPARISON:  04/04/2015 FINDINGS: Two-view chest shows right lower lobe pulmonary mass lesion with diffuse right middle lobe opacity and small right pleural effusion. The cardio pericardial silhouette is enlarged. Left lower lobe granuloma stable in the interval. There is pulmonary vascular congestion without overt pulmonary edema. The visualized bony structures of the thorax are intact. Telemetry leads overlie the chest. IMPRESSION: Right lower lobe pulmonary mass with probable right middle lobe atelectasis and small right pleural effusion. Imaging features compatible with the reported clinical history of lung cancer. Electronically Signed    By: Kennith Center M.D.   On: 09/16/2017 11:02   Dg Abd 1 View  Result Date: 08/31/2017 CLINICAL DATA:  OG tube placement. EXAM: ABDOMEN - 1 VIEW COMPARISON:  None. FINDINGS: OG tube appears adequately positioned in the stomach. Proximal side holes are approximately 5 cm below the gastroesophageal junction. Visualized bowel gas pattern is nonobstructive. IMPRESSION: Enteric tube appears adequately positioned in the stomach. Electronically Signed   By: Bary Richard M.D.   On: 08/31/2017 19:14   Ct Angio Chest Pe W And/or Wo Contrast  Result Date: 09/23/2017 CLINICAL DATA:  Lung cancer.  Short of breath. EXAM: CT ANGIOGRAPHY CHEST WITH CONTRAST TECHNIQUE: Multidetector CT imaging of the chest was performed using the standard protocol during bolus administration of intravenous contrast. Multiplanar CT image reconstructions and MIPs were obtained to evaluate the vascular anatomy. CONTRAST:  ISOVUE-370 IOPAMIDOL (ISOVUE-370) INJECTION 76% COMPARISON:  07/31/2010 FINDINGS: Cardiovascular: There are no filling defects in the pulmonary arterial tree to suggest acute pulmonary thromboembolism. Atherosclerotic calcifications of the aortic arch are present. No evidence of aortic dissection or aneurysm. Great vessels are grossly patent. There is narrowing of several of the central right pulmonary arteries and veins secondary to malignancy. Mild 3 vessel coronary artery calcification. Mediastinum/Nodes: There is abnormal confluence soft tissue mass throughout the mediastinum worrisome for malignancy. 5.5 x 6.7 cm subcarinal mass extending into the precarinal and right hilar regions. This is also continuous with a 3.3 x 2.7 cm right peritracheal mass. 2.0 cm prevascular lymph node on image 27. 1.6 cm high right paratracheal lymph node. Other smaller scattered lymph nodes are noted. No pericardial effusion. Visualized thyroid is unremarkable. The esophagus extends through the subcarinal mass and becomes imperceptible.  Malignant involvement of the esophagus cannot be excluded. See image 42 of series 4 abnormal soft tissue extends into the left hilar region. See image 42 of series 4 Lungs/Pleura: 4.9 x 3.6 cm mass in the superior segment right lower lobe is associated with surrounding patchy and interstitial opacities. Findings are worrisome for malignancy and possible lymphangitic spread of tumor. There is a second smaller more central right lower lobe lung mass measuring 1.5 cm on image 95 of series 6. Left lung is clear other than calcified granulomata and subsegmental atelectasis. There is an associated small right pleural effusion. Confluent mass extending from the mediastinum into the right hilum causes severe narrowing of the right mainstem bronchus and right lung lobar airways. The right middle lobe airway is occluded and there is collapse of the right middle lobe. Upper Abdomen: No acute abnormality. Musculoskeletal: There is a aggressive destructive  lesion in the manubrium worrisome for metastatic skeletal involvement of malignancy. T12 mA angioma has a benign appearance. Review of the MIP images confirms the above findings. IMPRESSION: No evidence of pulmonary thromboembolism. Right lower lobe lung mass associated with mediastinal and bilateral hilar adenopathy. Mediastinal adenopathy is confluent, extends in of the right hilum, and causes narrowing of airways as well as pulmonary vessel branches. Right pleural effusion. Right middle lobe collapse secondary to right middle lobe airway occlusion. Possible lymphangitic tumor in the right lower lobe. Possible involvement of the midesophagus. 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 Biopsy should provide diagnostic material. Aortic Atherosclerosis (ICD10-I70.0). Electronically Signed   By: Jolaine Click M.D.   On: Sep 26, 2017 13:10   [place] Chest (pleural Effusion)  Result Date: 08/31/2017 CLINICAL DATA:  SMALL RIGHT PLEURAL  EFFUSION EXAM: CHEST ULTRASOUND COMPARISON:  26-Sep-2017 FINDINGS: Ultrasound performed the right chest with the patient right side up decubitus. Small right effusion noted. There is not enough fluid to warrant thoracentesis for therapeutic purposes. Procedure not performed. IMPRESSION: Small right pleural effusion.  Thoracentesis not performed. Electronically Signed   By: Judie Petit.  Shick M.D.   On: 08/31/2017 10:55   Dg Chest Port 1 View  Result Date: 08/31/2017 CLINICAL DATA:  Endotracheal tube EXAM: PORTABLE CHEST 1 VIEW COMPARISON:  Chest x-ray from earlier same day. Chest CT dated Sep 26, 2017. FINDINGS: Endotracheal tube appears well positioned with tip approximately 3 cm above the carina. Heart size and mediastinal contours are stable. Right perihilar/infrahilar mass is stable in the short-term interval. No new lung findings. No pneumothorax seen. IMPRESSION: Endotracheal tube appears well positioned with tip approximately 3 cm above the carina. No other interval change compared to today's earlier chest x-ray and CT findings. Electronically Signed   By: Bary Richard M.D.   On: 08/31/2017 19:14   Dg Chest Port 1 View  Result Date: 08/31/2017 CLINICAL DATA:  Respiratory distress.  History of lung cancer. EXAM: PORTABLE CHEST 1 VIEW COMPARISON:  Chest radiographs and CT 09-26-17 FINDINGS: The cardiac silhouette is normal in size. Right paratracheal and hilar density corresponds to lymphadenopathy demonstrated on CT. Medial right lung base opacity is unchanged from the recent radiographs and corresponds to the known right lower lobe mass and right middle lobe collapse. A calcified granuloma is again noted in the left lung, which is otherwise clear. No large pleural effusion or pneumothorax is identified. IMPRESSION: Unchanged appearance of the chest. Persistent right middle lobe collapse in the setting of right lower lobe mass and lymphadenopathy. Electronically Signed   By: Sebastian Ache M.D.   On: 08/31/2017  14:14    I independently reviewed the above imaging studies.  Procedure: I catheterized his stoma with a 14 Fr catheter with return of 490 cc of urine.  I demonstrated to the nursing staff how to perform this and describe CIC technique and ordered a 14 Fr silicone catheter from the OR that they can use and clean with soap and water after each use.  They expressed their understanding and will perform CIC q 4-6 hrs.  Impression/Recommendation 1) Continent catheterizable urinary diversion:  Continue CIC every 4-6 hrs per instructions.  Once patient stabilizes, can have him resume self CIC.  Please call urology pager if further questions. Rashaun Wichert,LES 08/31/2017, 7:19 PM    Moody Bruins. MD   CC: Dr. Marchelle Gearing

## 2017-08-31 NOTE — Progress Notes (Signed)
IR aware of request for sternal lesion bx.  This has been approved, but we will wait for his respiratory status to improve prior to being able to sedate him.  Once this improves, we will plan on a bx certainly before he leaves the hospital per Dr. Annamaria Boots who has discussed this case with Dr. Denton Brick.  Henreitta Cea 3:09 PM 08/31/2017

## 2017-08-31 NOTE — Progress Notes (Signed)
  Echocardiogram 2D Echocardiogram has been performed.  Merrie Roof F 08/31/2017, 4:24 PM

## 2017-08-31 NOTE — Progress Notes (Signed)
RR- RN called to assess patient. BP 188/86 after med given. SRP, RN

## 2017-08-31 NOTE — Progress Notes (Signed)
Pt with acute/mild resp distress, tachypnea resp, Resp called for treatment, MD at bedside, orders received. Bed request for SD, Pt BP elevated 191/114 124 22 sat 89 on 6l. Hydrazaline given for BP.  Wife at bedside. Will cont to monitor. SRP, RN

## 2017-08-31 NOTE — Progress Notes (Signed)
Not enough fluid on [place] to safely perform thoracentesis.  Procedure deferred at this time.  Henry [name] 11:15 AM 08/31/2017

## 2017-08-31 NOTE — Progress Notes (Signed)
Palliative Medicine RN Note: Per Dr Anson Fret note on 1/1, patient would like to see PCCM and oncology before we meet with him. Our providers will monitor the chart and initiate PMT consult once those two specialties have seen him.  Marjie Skiff Dagmar Adcox, RN, BSN, [facility] 08/31/2017 8:43 AM Office [phone]

## 2017-08-31 NOTE — Consult Note (Signed)
Name: Henry [name] MRN: [id number] DOB: 1934/11/04    ADMISSION DATE:  Sep 13, 2017 CONSULTATION DATE: 08/31/2017 REFERRING MD : Vassie Loll  CHIEF COMPLAINT: Dyspnea  BRIEF PATIENT DESCRIPTION:  83 year old male who is dyspneic at rest. SIGNIFICANT EVENTS  Been to [facility] on 08/2017 for shortness of breath  STUDIES:  CT of the chest right lower lobe mass and small pleural effusion.    HISTORY OF PRESENT ILLNESS:    Henry [name] is an 83 year old male who has been followed by Dr. Carolin Sicks although newly diagnosed lung cancer and is being scheduled for further workup with PET scan.  Presented to the emergency room on 08/30/1998 increasing work of breathing nonproductive cough and general malaise.  Unable to eat for 1 week lack of appetite. Pulmonary critical care is consulted For continued evaluation of right lung mass and possible fiberoptic bronchoscopy.  Per Dr. Vassie Loll the plan was for possible note 08/19/2017 CT guided biopsy bronchoscopy with endobronchial ultrasound for subcarinal lymph node biopsy.  These modalities will be evaluated this time.  With his dyspnea tolerate a fiberoptic bronchoscopy at this time.  We will focus on improving his pulmonary status before invasive procedures are attempted.   PAST MEDICAL HISTORY :   has a past medical history of Anemia, Anisocoria, Diabetes mellitus, GERD (gastroesophageal reflux disease), Hyperlipidemia, Other abnormal glucose, and Vitamin D deficiency.  has a past surgical history that includes bladder resection for cancer (1998); Colonoscopy (2006 & 2012); Penile prosthesis implant (2011); and Cataract extraction. Prior to Admission medications   Medication Sig Start Date End Date Taking? Authorizing Provider  acetaminophen (TYLENOL) 500 MG tablet Take 500 mg by mouth every 6 (six) hours as needed for mild pain.   Yes [provider]  amLODipine (NORVASC) 10 MG tablet Take 1 tablet (10 mg total) by mouth daily.  06/15/17  Yes Myrlene Broker, MD  Menthol-Methyl Salicylate (MUSCLE RUB) 10-15 % CREA Apply 1 application topically as needed for muscle pain.   Yes [provider]  naproxen sodium (ALEVE) 220 MG tablet Take 220 mg by mouth daily as needed (pain).   Yes [provider]  traMADol (ULTRAM) 50 MG tablet Take 1-2 tablets (50-100 mg total) by mouth every 4 (four) hours as needed. 08/12/17  Yes Myrlene Broker, MD  zolpidem (AMBIEN) 5 MG tablet Take 1 tablet (5 mg total) by mouth at bedtime as needed for sleep. 06/15/17  Yes Myrlene Broker, MD   Allergies  Allergen Reactions  . Sulfa Drugs Cross Reactors     rash    FAMILY HISTORY:  family history includes Alcohol abuse in his mother; COPD in his father; Heart disease (age of onset: 46) in his brother; Stroke (age of onset: 59) in his mother. SOCIAL HISTORY:  reports that  has never smoked. he has never used smokeless tobacco. He reports that he drinks alcohol. He reports that he does not use drugs.  REVIEW OF SYSTEMS:   10 point review of system taken, please see HPI for positives and negatives.   SUBJECTIVE:  83 year old who is obviously uncomfortable and short of breath at rest. VITAL SIGNS: Temp:  [98.2 F (36.8 C)-98.8 F (37.1 C)] 98.2 F (36.8 C) (01/02 0550) Pulse Rate:  [123-136] 125 (01/02 0740) Resp:  [15-22] 22 (01/02 0550) BP: (155-194)/(88-118) 186/95 (01/02 0550) SpO2:  [87 %-94 %] 92 % (01/02 0803) Weight:  [83.3 kg (183 lb 10.3 oz)] 83.3 kg (183 lb 10.3 oz) (01/01 1758)  PHYSICAL EXAMINATION:  General: Obese white male in moderate respiratory distress at rest. Neuro: Moves all extremities x4 to command HEENT: No JVD is appreciated.  He does demonstrate components of vocal cord dysfunction with voice that is hoarse and noisy. Cardiovascular: Heart sounds are regular regular rate and rhythm Lungs: Decreased breath sounds in the bases coarse rhonchi bilaterally complaint of vocal  cord dysfunction noted Abdomen:   obese positive bowel sounds Musculoskeletal:  intact Skin: Warm and dry  Recent Labs  Lab 09/04/2017 1102 08/31/17 0417  NA 134* 134*  K 3.7 4.5  CL 95* 97*  CO2 27 25  BUN 16 17  CREATININE 0.87 0.81  GLUCOSE 186* 203*   Recent Labs  Lab 09/24/2017 1102 08/31/17 0417  HGB 13.1 12.3*  HCT 39.5 37.2*  WBC 19.7* 19.4*  PLT 337 293   Dg Chest 2 View  Result Date: 09/11/2017 CLINICAL DATA:  Lung cancer. EXAM: CHEST  2 VIEW COMPARISON:  04/04/2015 FINDINGS: Two-view chest shows right lower lobe pulmonary mass lesion with diffuse right middle lobe opacity and small right pleural effusion. The cardio pericardial silhouette is enlarged. Left lower lobe granuloma stable in the interval. There is pulmonary vascular congestion without overt pulmonary edema. The visualized bony structures of the thorax are intact. Telemetry leads overlie the chest. IMPRESSION: Right lower lobe pulmonary mass with probable right middle lobe atelectasis and small right pleural effusion. Imaging features compatible with the reported clinical history of lung cancer. Electronically Signed   By: Kennith Center M.D.   On: 09/11/2017 11:02   Ct Angio Chest Pe W And/or Wo Contrast  Result Date: 09/26/2017 CLINICAL DATA:  Lung cancer.  Short of breath. EXAM: CT ANGIOGRAPHY CHEST WITH CONTRAST TECHNIQUE: Multidetector CT imaging of the chest was performed using the standard protocol during bolus administration of intravenous contrast. Multiplanar CT image reconstructions and MIPs were obtained to evaluate the vascular anatomy. CONTRAST:  ISOVUE-370 IOPAMIDOL (ISOVUE-370) INJECTION 76% COMPARISON:  07/31/2010 FINDINGS: Cardiovascular: There are no filling defects in the pulmonary arterial tree to suggest acute pulmonary thromboembolism. Atherosclerotic calcifications of the aortic arch are present. No evidence of aortic dissection or aneurysm. Great vessels are grossly patent. There is  narrowing of several of the central right pulmonary arteries and veins secondary to malignancy. Mild 3 vessel coronary artery calcification. Mediastinum/Nodes: There is abnormal confluence soft tissue mass throughout the mediastinum worrisome for malignancy. 5.5 x 6.7 cm subcarinal mass extending into the precarinal and right hilar regions. This is also continuous with a 3.3 x 2.7 cm right peritracheal mass. 2.0 cm prevascular lymph node on image 27. 1.6 cm high right paratracheal lymph node. Other smaller scattered lymph nodes are noted. No pericardial effusion. Visualized thyroid is unremarkable. The esophagus extends through the subcarinal mass and becomes imperceptible. Malignant involvement of the esophagus cannot be excluded. See image 42 of series 4 abnormal soft tissue extends into the left hilar region. See image 42 of series 4 Lungs/Pleura: 4.9 x 3.6 cm mass in the superior segment right lower lobe is associated with surrounding patchy and interstitial opacities. Findings are worrisome for malignancy and possible lymphangitic spread of tumor. There is a second smaller more central right lower lobe lung mass measuring 1.5 cm on image 95 of series 6. Left lung is clear other than calcified granulomata and subsegmental atelectasis. There is an associated small right pleural effusion. Confluent mass extending from the mediastinum into the right hilum causes severe narrowing of the right mainstem bronchus and right lung lobar airways.  The right middle lobe airway is occluded and there is collapse of the right middle lobe. Upper Abdomen: No acute abnormality. Musculoskeletal: There is a aggressive destructive lesion in the manubrium worrisome for metastatic skeletal involvement of malignancy. T12 mA angioma has a benign appearance. Review of the MIP images confirms the above findings. IMPRESSION: No evidence of pulmonary thromboembolism. Right lower lobe lung mass associated with mediastinal and bilateral hilar  adenopathy. Mediastinal adenopathy is confluent, extends in of the right hilum, and causes narrowing of airways as well as pulmonary vessel branches. Right pleural effusion. Right middle lobe collapse secondary to right middle lobe airway occlusion. Possible lymphangitic tumor in the right lower lobe. Possible involvement of the midesophagus. 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 Biopsy should provide diagnostic material. Aortic Atherosclerosis (ICD10-I70.0). Electronically Signed   By: Jolaine Click M.D.   On: 09/27/2017 13:10   [place] Chest (pleural Effusion)  Result Date: 08/31/2017 CLINICAL DATA:  SMALL RIGHT PLEURAL EFFUSION EXAM: CHEST ULTRASOUND COMPARISON:  09/22/2017 FINDINGS: Ultrasound performed the right chest with the patient right side up decubitus. Small right effusion noted. There is not enough fluid to warrant thoracentesis for therapeutic purposes. Procedure not performed. IMPRESSION: Small right pleural effusion.  Thoracentesis not performed. Electronically Signed   By: Judie Petit.  Shick M.D.   On: 08/31/2017 10:55    ASSESSMENT :  Discussion: Right lower lobe lung mass with hilar and mediastinal lymph node spread.  He has a small left effusion but is too small to tap.  Which was attempted on 08/31/2017.  He is increasingly short of breath and was admitted on 09/19/2017 increasing shortness of breath and overall pain.  He is unable to eat due to lack of appetite.  Further treatment modalities and assessment tools will be explored at this time.  Right lower lobe lung mass Right mediastinal and hilar adenopathy Increasing dyspnea ? Post obstructive pna Suspected vocal cord dysfunction Pain   PLAN: He will need a biopsy of the right lower lung mass possible edema versus CT-guided aspiration.  Oxygen as needed Steroids as ordered Increase his Protonix to twice daily for suspected aspiration Agree with antibiotics at this time Continue  bronchodilators as ordered Further evaluation per Dr. Marchelle Gearing on 08/31/2017.  Brett Canales Jaze Rodino ACNP [facility] Pager [phone] till 1 pm If no answer page [phone] 08/31/2017, 11:40 AM

## 2017-08-31 NOTE — Procedures (Signed)
Intubation Procedure Note Henry [name] [id number] [birth year]  Procedure: Intubation Indications: Respiratory insufficiency  Procedure Details Consent: Risks of procedure as well as the alternatives and risks of each were explained to the (patient/caregiver).  Consent for procedure obtained. Time Out: Verified patient identification, verified procedure, site/side was marked, verified correct patient position, special equipment/implants available, medications/allergies/relevent history reviewed, required imaging and test results available.  Performed  Maximum sterile technique was used including cap, gloves, gown, hand hygiene and mask.  MAC    Evaluation Hemodynamic Status: Transient hypotension treated with pressors; O2 sats: stable throughout Patient's Current Condition: stable Complications: No apparent complications Patient did tolerate procedure well. Chest X-ray ordered to verify placement.  CXR: pending.   Brand Males 08/31/2017  Dr. Brand Males, M.D., F.C.C.P Pulmonary and Critical Care Medicine Staff Physician, [place] Director - Interstitial Lung Disease  Program  [facility] [place], [place], [address]  Pager: [phone], If no answer or between  15:00h - 7:00h: call [phone] Telephone: [phone]

## 2017-09-01 ENCOUNTER — Inpatient Hospital Stay (HOSPITAL_COMMUNITY): Payer: Medicare Other

## 2017-09-01 DIAGNOSIS — Z515 Encounter for palliative care: Secondary | ICD-10-CM

## 2017-09-01 LAB — GLUCOSE, CAPILLARY
GLUCOSE-CAPILLARY: 190 mg/dL — AB (ref 65–99)
GLUCOSE-CAPILLARY: 152 mg/dL — AB (ref 65–99)
GLUCOSE-CAPILLARY: 116 mg/dL — AB (ref 65–99)
Glucose-Capillary: 185 mg/dL — ABNORMAL HIGH (ref 65–99)
Glucose-Capillary: 206 mg/dL — ABNORMAL HIGH (ref 65–99)
Glucose-Capillary: 161 mg/dL — ABNORMAL HIGH (ref 65–99)

## 2017-09-01 LAB — MAGNESIUM
MAGNESIUM: 2 mg/dL (ref 1.7–2.4)
Magnesium: 2 mg/dL (ref 1.7–2.4)

## 2017-09-01 LAB — LACTIC ACID, PLASMA: LACTIC ACID, VENOUS: 2.6 mmol/L — AB (ref 0.5–1.9)

## 2017-09-01 LAB — TROPONIN I: Troponin I: 0.05 ng/mL (ref <0.03)

## 2017-09-01 LAB — PHOSPHORUS
PHOSPHORUS: 3.4 mg/dL (ref 2.5–4.6)
Phosphorus: 3.4 mg/dL (ref 2.5–4.6)

## 2017-09-01 LAB — PROCALCITONIN: Procalcitonin: 0.12 ng/mL

## 2017-09-01 MED ORDER — CHLORHEXIDINE GLUCONATE CLOTH 2 % EX PADS
6.0000 | MEDICATED_PAD | Freq: Every day | CUTANEOUS | Status: DC
  Administered 2017-09-02 (×2): 6 via TOPICAL

## 2017-09-01 MED ORDER — INSULIN ASPART 100 UNIT/ML ~~LOC~~ SOLN
4.0000 [IU] | SUBCUTANEOUS | Status: DC
  Administered 2017-09-01 – 2017-09-02 (×6): 4 [IU] via SUBCUTANEOUS

## 2017-09-01 MED ORDER — VITAL HIGH PROTEIN PO LIQD: 1000.0000 mL | ORAL | Status: DC

## 2017-09-01 MED ORDER — HEPARIN SODIUM (PORCINE) 5000 UNIT/ML IJ SOLN
5000.0000 [IU] | Freq: Three times a day (TID) | INTRAMUSCULAR | Status: DC
  Administered 2017-09-01 – 2017-09-09 (×24): 5000 [IU] via SUBCUTANEOUS
  Filled 2017-09-01 (×24): qty 1

## 2017-09-01 MED ORDER — SODIUM CHLORIDE 0.9% FLUSH
10.0000 mL | Freq: Two times a day (BID) | INTRAVENOUS | Status: DC
  Administered 2017-09-01 – 2017-09-05 (×8): 10 mL
  Administered 2017-09-06: 20 mL
  Administered 2017-09-06 – 2017-09-09 (×6): 10 mL

## 2017-09-01 MED ORDER — SODIUM CHLORIDE 0.9% FLUSH: 10.0000 mL | INTRAVENOUS | Status: DC | PRN

## 2017-09-01 MED ORDER — INSULIN ASPART 100 UNIT/ML ~~LOC~~ SOLN
2.0000 [IU] | SUBCUTANEOUS | Status: DC
  Administered 2017-09-01 – 2017-09-02 (×6): 4 [IU] via SUBCUTANEOUS
  Administered 2017-09-02: 2 [IU] via SUBCUTANEOUS
  Administered 2017-09-02: 4 [IU] via SUBCUTANEOUS
  Administered 2017-09-03: 2 [IU] via SUBCUTANEOUS
  Administered 2017-09-03 (×3): 4 [IU] via SUBCUTANEOUS
  Administered 2017-09-03: 2 [IU] via SUBCUTANEOUS
  Administered 2017-09-03: 4 [IU] via SUBCUTANEOUS
  Administered 2017-09-04 – 2017-09-05 (×4): 2 [IU] via SUBCUTANEOUS

## 2017-09-01 MED ORDER — PRO-STAT SUGAR FREE PO LIQD
30.0000 mL | Freq: Two times a day (BID) | ORAL | Status: DC
  Administered 2017-09-01: 30 mL
  Filled 2017-09-01: qty 30

## 2017-09-01 MED ORDER — INSULIN ASPART 100 UNIT/ML ~~LOC~~ SOLN: 2.0000 [IU] | SUBCUTANEOUS | Status: DC

## 2017-09-01 MED ORDER — VITAL HIGH PROTEIN PO LIQD
1000.0000 mL | ORAL | Status: DC
  Filled 2017-09-01: qty 1000

## 2017-09-01 NOTE — Progress Notes (Signed)
Name: Henry [name] MRN: [id number] DOB: [birth year]    ADMISSION DATE:  09/11/2017 CONSULTATION DATE: 08/31/2017 REFERRING MD : Barton Dubois  CHIEF COMPLAINT: Dyspnea  BRIEF PATIENT DESCRIPTION:   83 year old male w/ recent dx of lung mass.  Presented w/ progressive Dyspnea requiring intubation for work of breathing.    Per Dr. Elsworth Soho the plan was for possible note 08/19/2017 CT guided biopsy bronchoscopy with endobronchial ultrasound for subcarinal lymph node biopsy.  These modalities will be evaluated this time.  With his dyspnea tolerate a fiberoptic bronchoscopy at this time.  We will focus on improving his pulmonary status before invasive procedures are attempted.  SIGNIFICANT EVENTS  Been to [facility] on 09/23/17 for shortness of breath  STUDIES:  CT of the chest right lower lobe mass and small pleural effusion.   SUBJECTIVE:  Sedated on vent   VITAL SIGNS: Temp:  [98.1 F (36.7 C)-98.6 F (37 C)] 98.6 F (37 C) (01/03 0801) Pulse Rate:  [58-130] 105 (01/03 0700) Resp:  [14-24] 24 (01/03 0700) BP: (114-216)/(59-137) 124/64 (01/03 0700) SpO2:  [88 %-100 %] 95 % (01/03 0700) FiO2 (%):  [40 %-100 %] 40 % (01/03 0328)  PHYSICAL EXAMINATION: General: 83 year old white male currently sedated on ventilator no acute distress HEENT: Normocephalic atraumatic sclera are nonicteric.  Orally intubated no jugular venous distention Pulmonary: Diffuse expiratory wheeze, no accessory use, equal chest rise on ventilator Cardiac: Regular rate and rhythm no murmur rub or gallop Abdomen: Soft nontender no organomegaly positive bowel sounds Extremities/musculoskeletal: Brisk cap refill no significant edema warm, dry. Neuro/psych: Awake, appropriate, moves extremities, follows commands.    Recent Labs  Lab 09/28/2017 1102 08/31/17 0417  NA 134* 134*  K 3.7 4.5  CL 95* 97*  CO2 27 25  BUN 16 17  CREATININE 0.87 0.81  GLUCOSE 186* 203*   Recent Labs  Lab 09/22/2017 1102  08/31/17 0417  HGB 13.1 12.3*  HCT 39.5 37.2*  WBC 19.7* 19.4*  PLT 337 293   Dg Chest 2 View  Result Date: 09/07/2017 CLINICAL DATA:  Lung cancer. EXAM: CHEST  2 VIEW COMPARISON:  04/04/2015 FINDINGS: Two-view chest shows right lower lobe pulmonary mass lesion with diffuse right middle lobe opacity and small right pleural effusion. The cardio pericardial silhouette is enlarged. Left lower lobe granuloma stable in the interval. There is pulmonary vascular congestion without overt pulmonary edema. The visualized bony structures of the thorax are intact. Telemetry leads overlie the chest. IMPRESSION: Right lower lobe pulmonary mass with probable right middle lobe atelectasis and small right pleural effusion. Imaging features compatible with the reported clinical history of lung cancer. Electronically Signed   By: Misty Stanley M.D.   On: 09/13/2017 11:02   Dg Abd 1 View  Result Date: 08/31/2017 CLINICAL DATA:  OG tube placement. EXAM: ABDOMEN - 1 VIEW COMPARISON:  None. FINDINGS: OG tube appears adequately positioned in the stomach. Proximal side holes are approximately 5 cm below the gastroesophageal junction. Visualized bowel gas pattern is nonobstructive. IMPRESSION: Enteric tube appears adequately positioned in the stomach. Electronically Signed   By: Franki Cabot M.D.   On: 08/31/2017 19:14   Ct Angio Chest Pe W And/or Wo Contrast  Result Date: 09/24/2017 CLINICAL DATA:  Lung cancer.  Short of breath. EXAM: CT ANGIOGRAPHY CHEST WITH CONTRAST TECHNIQUE: Multidetector CT imaging of the chest was performed using the standard protocol during bolus administration of intravenous contrast. Multiplanar CT image reconstructions and MIPs were obtained to evaluate the vascular anatomy. CONTRAST:  19mL ISOVUE-370 IOPAMIDOL (ISOVUE-370) INJECTION 76% COMPARISON:  07/31/2010 FINDINGS: Cardiovascular: There are no filling defects in the pulmonary arterial tree to suggest acute pulmonary thromboembolism.  Atherosclerotic calcifications of the aortic arch are present. No evidence of aortic dissection or aneurysm. Great vessels are grossly patent. There is narrowing of several of the central right pulmonary arteries and veins secondary to malignancy. Mild 3 vessel coronary artery calcification. Mediastinum/Nodes: There is abnormal confluence soft tissue mass throughout the mediastinum worrisome for malignancy. 5.5 x 6.7 cm subcarinal mass extending into the precarinal and right hilar regions. This is also continuous with a 3.3 x 2.7 cm right peritracheal mass. 2.0 cm prevascular lymph node on image 27. 1.6 cm high right paratracheal lymph node. Other smaller scattered lymph nodes are noted. No pericardial effusion. Visualized thyroid is unremarkable. The esophagus extends through the subcarinal mass and becomes imperceptible. Malignant involvement of the esophagus cannot be excluded. See image 42 of series 4 abnormal soft tissue extends into the left hilar region. See image 42 of series 4 Lungs/Pleura: 4.9 x 3.6 cm mass in the superior segment right lower lobe is associated with surrounding patchy and interstitial opacities. Findings are worrisome for malignancy and possible lymphangitic spread of tumor. There is a second smaller more central right lower lobe lung mass measuring 1.5 cm on image 95 of series 6. Left lung is clear other than calcified granulomata and subsegmental atelectasis. There is an associated small right pleural effusion. Confluent mass extending from the mediastinum into the right hilum causes severe narrowing of the right mainstem bronchus and right lung lobar airways. The right middle lobe airway is occluded and there is collapse of the right middle lobe. Upper Abdomen: No acute abnormality. Musculoskeletal: There is a aggressive destructive lesion in the manubrium worrisome for metastatic skeletal involvement of malignancy. T12 mA angioma has a benign appearance. Review of the MIP images  confirms the above findings. IMPRESSION: No evidence of pulmonary thromboembolism. Right lower lobe lung mass associated with mediastinal and bilateral hilar adenopathy. Mediastinal adenopathy is confluent, extends in of the right hilum, and causes narrowing of airways as well as pulmonary vessel branches. Right pleural effusion. Right middle lobe collapse secondary to right middle lobe airway occlusion. Possible lymphangitic tumor in the right lower lobe. Possible involvement of the midesophagus. 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 Biopsy should provide diagnostic material. Aortic Atherosclerosis (ICD10-I70.0). Electronically Signed   By: Marybelle Killings M.D.   On: 09/04/2017 13:10   [place] Chest (pleural Effusion)  Result Date: 08/31/2017 CLINICAL DATA:  SMALL RIGHT PLEURAL EFFUSION EXAM: CHEST ULTRASOUND COMPARISON:  08/31/2017 FINDINGS: Ultrasound performed the right chest with the patient right side up decubitus. Small right effusion noted. There is not enough fluid to warrant thoracentesis for therapeutic purposes. Procedure not performed. IMPRESSION: Small right pleural effusion.  Thoracentesis not performed. Electronically Signed   By: Jerilynn Mages.  Shick M.D.   On: 08/31/2017 10:55   Dg Chest Port 1 View  Result Date: 09/01/2017 CLINICAL DATA:  Intubation. EXAM: PORTABLE CHEST 1 VIEW COMPARISON:  08/31/2017.  CT 08/31/2017. FINDINGS: Endotracheal tube and NG tube in stable position. Mediastinal fullness consistent with known adenopathy noted. Right hilar/infrahilar mass again noted. No pleural effusion or pneumothorax. IMPRESSION: 1. Lines and tubes stable position. 2. Findings consist with mediastinal adenopathy and right hilar/ infrahilar mass again noted. No interim change from prior exams. Electronically Signed   By: Marcello Moores  Register   On: 09/01/2017 06:18   Dg Chest  Port 1 View  Result Date: 08/31/2017 CLINICAL DATA:  Endotracheal tube EXAM: PORTABLE CHEST  1 VIEW COMPARISON:  Chest x-ray from earlier same day. Chest CT dated 09/06/2017. FINDINGS: Endotracheal tube appears well positioned with tip approximately 3 cm above the carina. Heart size and mediastinal contours are stable. Right perihilar/infrahilar mass is stable in the short-term interval. No new lung findings. No pneumothorax seen. IMPRESSION: Endotracheal tube appears well positioned with tip approximately 3 cm above the carina. No other interval change compared to today's earlier chest x-ray and CT findings. Electronically Signed   By: Franki Cabot M.D.   On: 08/31/2017 19:14   Dg Chest Port 1 View  Result Date: 08/31/2017 CLINICAL DATA:  Respiratory distress.  History of lung cancer. EXAM: PORTABLE CHEST 1 VIEW COMPARISON:  Chest radiographs and CT 09/12/2017 FINDINGS: The cardiac silhouette is normal in size. Right paratracheal and hilar density corresponds to lymphadenopathy demonstrated on CT. Medial right lung base opacity is unchanged from the recent radiographs and corresponds to the known right lower lobe mass and right middle lobe collapse. A calcified granuloma is again noted in the left lung, which is otherwise clear. No large pleural effusion or pneumothorax is identified. IMPRESSION: Unchanged appearance of the chest. Persistent right middle lobe collapse in the setting of right lower lobe mass and lymphadenopathy. Electronically Signed   By: Logan Bores M.D.   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 Still w/ sig RML/RLL airspace disease.  -Asked IR to see again re: sternal bx. They do not feel that they can provide sternal  bx Plan Cont full vent support PAD protocol Will likely need XRT before we have any hope at getting him off vent.  Would also wonder about stent placement depending on effectiveness of XRT.Marland Kitchen Would require tertiary transfer for this. Will d/w Dr Chase Caller Cont empiric abx for possible post-obstructive Process day # 3 cefepime  F/u culture data Cont systemic steroids  Cont BD therapy  H/o bladder cancer s/p radical cystectomy w/ urinary diversion Plan Cont CIC catheterization per nursing q 4-6 h  Anemia of Chronic disease Plan Cont [place] heparin   Leukocytosis  -suspect steroid related Plan Trend fever and WBC curve  Hyperglycemia  Plan ssi  Adding standing coverage as well   DVT prophylaxis: [place] heparin SUP: ppi  Diet: tube feeds Activity: BR Disposition : ICU  My cct 45 minutes  Erick Colace ACNP-BC [place] Pager # [phone] OR # [phone] if no answer

## 2017-09-01 NOTE — Progress Notes (Signed)
Peripherally Inserted Central Catheter/Midline Placement  The IV Nurse has discussed with the patient and/or persons authorized to consent for the patient, the purpose of this procedure and the potential benefits and risks involved with this procedure.  The benefits include less needle sticks, lab draws from the catheter, and the patient may be discharged home with the catheter. Risks include, but not limited to, infection, bleeding, blood clot (thrombus formation), and puncture of an artery; nerve damage and irregular heartbeat and possibility to perform a PICC exchange if needed/ordered by physician.  Alternatives to this procedure were also discussed.  Bard Power PICC patient education guide, fact sheet on infection prevention and patient information card has been provided to patient /or left at bedside.    PICC/Midline Placement Documentation  PICC Triple Lumen 83/15/17 PICC Right Basilic 39 cm (Active)  Indication for Insertion or Continuance of Line Prolonged intravenous therapies 09/01/2017  8:00 AM  Site Assessment Clean;Dry;Intact 09/01/2017  8:00 AM  Lumen #1 Status Flushed;Blood return noted 09/01/2017  8:00 AM  Lumen #2 Status Flushed;Blood return noted 09/01/2017  8:00 AM  Lumen #3 Status Flushed;Blood return noted 09/01/2017  8:00 AM  Dressing Type Transparent 09/01/2017  8:00 AM  Dressing Status Clean;Dry;Intact;Antimicrobial disc in place 09/01/2017  8:00 AM  Dressing Change Due 09/08/17 09/01/2017  8:00 AM    Telephone consent   Jule Economy Horton 09/01/2017, 8:33 AM

## 2017-09-01 NOTE — Progress Notes (Signed)
Nutrition Follow-up  DOCUMENTATION CODES:   Not applicable  INTERVENTION:   Vital High Protein @ 50 ml/hr  Provides: 1200 kcals (1728 kcal with propofol), 105 grams protein, 1008 ml free water. Meets 104% of calorie needs and 100% of protein needs  NUTRITION DIAGNOSIS:   Increased nutrient needs related to cancer and cancer related treatments, catabolic illness as evidenced by estimated needs.  Ongoing  GOAL:   Patient will meet greater than or equal to 90% of their needs  NPO- to start TF  MONITOR:   PO intake, Supplement acceptance, Weight trends, I & O's  REASON FOR ASSESSMENT:   Malnutrition Screening Tool    ASSESSMENT:   Pt with PMH of DM, GERD, HLD, HTN, and newly diagnosed lung cancer presents with chest pain and SOB found acute respiratory failure with hypoxia   1/2- placed on BiPAP without any improvement, pt intubated to protect airway, bronchoscopy shows massive subcarinal mass.  RD consulted for tube feeding management. Started on PepUP protocol. Calorie and protein needs adjusted according to new vent setting. Will monitor tube feeding rate and adjust according to propofol.   Patient is currently intubated on ventilator support MV: 11.0 L/min Temp (24hrs), Avg:98.3 F (36.8 C), Min:98.1 F (36.7 C), Max:98.6 F (37 C) BP: 160/78 MAP: 103 Propofol: 20 ml/hr- provides 528 kcal  Medications reviewed and include: SSI, solumedrol, IV abx, D5 @ 50 ml/hr, propofol Labs reviewed.   Diet Order:  Diet NPO time specified Except for: Sips with Meds  EDUCATION NEEDS:   No education needs have been identified at this time  Skin:  Skin Assessment: Reviewed RN Assessment  Last BM:  08/29/17  Height:   Ht Readings from Last 1 Encounters:  08/31/17 5\' 6"  (1.676 m)    Weight:   Wt Readings from Last 1 Encounters:  09/29/2017 183 lb 10.3 oz (83.3 kg)    Ideal Body Weight:  64.5 kg  BMI:  Body mass index is 29.64 kg/m.  Estimated Nutritional  Needs:   Kcal:  8264 (PSU)  Protein:  100-110 grams (1.2-1.3 g/kg)  Fluid:  >1.6 L/day    Mariana Single RD, LDN Clinical Nutrition Pager # - [phone]

## 2017-09-01 NOTE — Progress Notes (Addendum)
Palliative:  Discussed plan for potential EBUS as soon as possible to obtain tissue biopsy with PCCM. I have just missed Mr. Delange's wife and children but his stepchildren are at bedside. Mr. Age is alert and communicative and able to write. I introduced myself as palliative care and a support to himself and his family during this difficult illness - he gave me a thumbs up. He denies pain or discomfort. Denies any further concerns (I asked if there was anything bothering him besides myself - he smiled and appeared to laugh). Denies concerns/discomforts at this time. Tolerating vent well. I will follow up tomorrow for support.   Unfortunately poor prognosis d/t extent of lung mass with limited options. PCCM continuing to discuss options with patient and family.   15 min  Vinie Sill, NP Palliative Medicine Team Pager # [phone] (M-F 8a-5p) Team Phone # [phone] (Nights/Weekends)

## 2017-09-01 NOTE — Progress Notes (Signed)
I was  advised by Salvadore Dom, NP for PCCM service that Pt is intubated and will remain under PCCM service for now.  Pt was neither seen nor evaluated by hospitalist service today, PCCM service was notify [facility] hospitalist service when they are ready for patient to be transferred back to our service  Roxan Hockey, MD

## 2017-09-01 NOTE — Progress Notes (Signed)
Request received for sternal lesion biopsy on patient.  Latest imaging studies were reviewed by Dr. Kathlene Cote.  Based on his review he states patient should be extubated before any percutaneous biopsies are performed and that right lower lobe lung mass biopsy over sternal lesion would be more feasible/higher yielding regarding pathology. Please contact him at [phone] with any additional questions.  Above discussed with CCM NP and next step may be EBUS.

## 2017-09-02 ENCOUNTER — Inpatient Hospital Stay (HOSPITAL_COMMUNITY): Payer: Medicare Other

## 2017-09-02 LAB — BLOOD GAS, ARTERIAL
ACID-BASE DEFICIT: 2.6 mmol/L — AB (ref 0.0–2.0)
BICARBONATE: 24.4 mmol/L (ref 20.0–28.0)
Drawn by: 441261
FIO2: 70
O2 SAT: 96.9 %
PEEP/CPAP: 5 cmH2O
PH ART: 7.272 — AB (ref 7.350–7.450)
Patient temperature: 37
RATE: 14 resp/min
VT: 500 mL
pCO2 arterial: 54.7 mmHg — ABNORMAL HIGH (ref 32.0–48.0)
pO2, Arterial: 112 mmHg — ABNORMAL HIGH (ref 83.0–108.0)

## 2017-09-02 LAB — BASIC METABOLIC PANEL
ANION GAP: 9 (ref 5–15)
ANION GAP: 8 (ref 5–15)
BUN: 23 mg/dL — ABNORMAL HIGH (ref 6–20)
BUN: 25 mg/dL — AB (ref 6–20)
CALCIUM: 9 mg/dL (ref 8.9–10.3)
CHLORIDE: 105 mmol/L (ref 101–111)
CO2: 24 mmol/L (ref 22–32)
CO2: 23 mmol/L (ref 22–32)
CREATININE: 1.08 mg/dL (ref 0.61–1.24)
Calcium: 9.1 mg/dL (ref 8.9–10.3)
Chloride: 107 mmol/L (ref 101–111)
Creatinine, Ser: 0.95 mg/dL (ref 0.61–1.24)
GFR calc Af Amer: >60 mL/min (ref >60)
GFR calc non Af Amer: >60 mL/min (ref >60)
GFR calc non Af Amer: >60 mL/min (ref >60)
GLUCOSE: 197 mg/dL — AB (ref 65–99)
Glucose, Bld: 186 mg/dL — ABNORMAL HIGH (ref 65–99)
POTASSIUM: 2.9 mmol/L — AB (ref 3.5–5.1)
Potassium: 3.9 mmol/L (ref 3.5–5.1)
SODIUM: 138 mmol/L (ref 135–145)
SODIUM: 138 mmol/L (ref 135–145)

## 2017-09-02 LAB — GLUCOSE, CAPILLARY
GLUCOSE-CAPILLARY: 165 mg/dL — AB (ref 65–99)
GLUCOSE-CAPILLARY: 170 mg/dL — AB (ref 65–99)
Glucose-Capillary: 139 mg/dL — ABNORMAL HIGH (ref 65–99)
Glucose-Capillary: 155 mg/dL — ABNORMAL HIGH (ref 65–99)
Glucose-Capillary: 200 mg/dL — ABNORMAL HIGH (ref 65–99)
Glucose-Capillary: 155 mg/dL — ABNORMAL HIGH (ref 65–99)

## 2017-09-02 LAB — PHOSPHORUS
Phosphorus: 3.9 mg/dL (ref 2.5–4.6)
Phosphorus: 4.7 mg/dL — ABNORMAL HIGH (ref 2.5–4.6)
Phosphorus: 4.1 mg/dL (ref 2.5–4.6)

## 2017-09-02 LAB — MAGNESIUM
Magnesium: 2.1 mg/dL (ref 1.7–2.4)
Magnesium: 2 mg/dL (ref 1.7–2.4)
Magnesium: 2 mg/dL (ref 1.7–2.4)

## 2017-09-02 LAB — CBC
HCT: 32.6 % — ABNORMAL LOW (ref 39.0–52.0)
HEMOGLOBIN: 10.5 g/dL — AB (ref 13.0–17.0)
MCH: 27.9 pg (ref 26.0–34.0)
MCHC: 32.2 g/dL (ref 30.0–36.0)
MCV: 86.7 fL (ref 78.0–100.0)
PLATELETS: 279 10*3/uL (ref 150–400)
RBC: 3.76 MIL/uL — AB (ref 4.22–5.81)
RDW: 15 % (ref 11.5–15.5)
WBC: 25.2 10*3/uL — AB (ref 4.0–10.5)

## 2017-09-02 LAB — PROCALCITONIN: Procalcitonin: 0.11 ng/mL

## 2017-09-02 MED ORDER — FENTANYL CITRATE (PF) 100 MCG/2ML IJ SOLN
INTRAMUSCULAR | Status: AC
  Administered 2017-09-02: 100 ug
  Filled 2017-09-02: qty 2

## 2017-09-02 MED ORDER — FENTANYL CITRATE (PF) 100 MCG/2ML IJ SOLN
50.0000 ug | INTRAMUSCULAR | Status: DC | PRN
  Filled 2017-09-02: qty 2

## 2017-09-02 MED ORDER — MIDAZOLAM HCL 2 MG/2ML IJ SOLN
INTRAMUSCULAR | Status: AC
  Administered 2017-09-02: 2 mg
  Filled 2017-09-02: qty 2

## 2017-09-02 MED ORDER — PANTOPRAZOLE SODIUM 40 MG PO PACK
40.0000 mg | PACK | Freq: Every day | ORAL | Status: DC
  Administered 2017-09-02 – 2017-09-03 (×2): 40 mg
  Filled 2017-09-02 (×3): qty 20

## 2017-09-02 MED ORDER — PROPOFOL 1000 MG/100ML IV EMUL
0.0000 ug/kg/min | INTRAVENOUS | Status: DC
  Administered 2017-09-02: 5 ug/kg/min via INTRAVENOUS
  Administered 2017-09-02 – 2017-09-03 (×4): 50 ug/kg/min via INTRAVENOUS
  Filled 2017-09-02: qty 200
  Filled 2017-09-02 (×2): qty 100
  Filled 2017-09-02: qty 200
  Filled 2017-09-02 (×4): qty 100

## 2017-09-02 MED ORDER — PRO-STAT SUGAR FREE PO LIQD: 30.0000 mL | Freq: Two times a day (BID) | ORAL | Status: DC

## 2017-09-02 MED ORDER — VITAL HIGH PROTEIN PO LIQD
1000.0000 mL | ORAL | Status: DC
  Administered 2017-09-03 (×2): 1000 mL
  Filled 2017-09-02: qty 1000

## 2017-09-02 MED ORDER — ETOMIDATE 2 MG/ML IV SOLN
20.0000 mg | Freq: Once | INTRAVENOUS | Status: AC
  Administered 2017-09-02: 20 mg via INTRAVENOUS

## 2017-09-02 MED ORDER — LORAZEPAM 2 MG/ML IJ SOLN
INTRAMUSCULAR | Status: AC
  Filled 2017-09-02: qty 1

## 2017-09-02 MED ORDER — LORAZEPAM 2 MG/ML IJ SOLN
0.5000 mg | INTRAMUSCULAR | Status: DC | PRN
  Administered 2017-09-02: 0.5 mg via INTRAVENOUS

## 2017-09-02 MED ORDER — ROCURONIUM BROMIDE 50 MG/5ML IV SOLN
70.0000 mg | Freq: Once | INTRAVENOUS | Status: AC
  Administered 2017-09-02: 70 mg via INTRAVENOUS
  Filled 2017-09-02: qty 7

## 2017-09-02 MED ORDER — POTASSIUM CHLORIDE 10 MEQ/100ML IV SOLN
10.0000 meq | INTRAVENOUS | Status: AC
  Administered 2017-09-02 (×6): 10 meq via INTRAVENOUS
  Filled 2017-09-02 (×6): qty 100

## 2017-09-02 MED ORDER — POTASSIUM CHLORIDE 20 MEQ/15ML (10%) PO SOLN
40.0000 meq | ORAL | Status: DC
  Filled 2017-09-02: qty 30

## 2017-09-02 MED ORDER — FENTANYL CITRATE (PF) 100 MCG/2ML IJ SOLN
50.0000 ug | INTRAMUSCULAR | Status: DC | PRN
  Administered 2017-09-02 – 2017-09-03 (×2): 50 ug via INTRAVENOUS
  Filled 2017-09-02 (×2): qty 2

## 2017-09-02 MED ORDER — VITAL HIGH PROTEIN PO LIQD
1000.0000 mL | ORAL | Status: DC
  Filled 2017-09-02: qty 1000

## 2017-09-02 NOTE — Procedures (Signed)
Intubation Procedure Note Henry [name] [id number] 1935-03-08  Procedure: Intubation Indications: Respiratory insufficiency  Procedure Details Consent: Risks of procedure as well as the alternatives and risks of each were explained to the (patient/caregiver).  Consent for procedure obtained. Time Out: Verified patient identification, verified procedure, site/side was marked, verified correct patient position, special equipment/implants available, medications/allergies/relevent history reviewed, required imaging and test results available.  Performed  Maximum sterile technique was used including gloves, hand hygiene and mask.  4 Glide     Evaluation Hemodynamic Status: BP stable throughout; O2 sats: stable throughout Patient's Current Condition: stable Complications: No apparent complications Patient did tolerate procedure well. Chest X-ray ordered to verify placement.  CXR: pending.   Lamonte Sakai 09/02/2017

## 2017-09-02 NOTE — Procedures (Signed)
Extubation Procedure Note  Patient Details:   Name: Henry [name] DOB: 01-07-1935 MRN: [id number]   Airway Documentation:     Evaluation  O2 sats: Decreased to low 76'F Complications: No apparent complications other than O2 saturation decrease. Patient did tolerate procedure well. Bilateral Breath Sounds: Expiratory wheezes, Diminished   Yes   Patient manually ventilated by PCCM NP Salvadore Dom). Patient placed on heated high flow [place]. O2 sats maintaining at this time.   Lamonte Sakai 09/02/2017, 8:33 AM

## 2017-09-02 NOTE — Progress Notes (Signed)
Name: Henry [name] MRN: [id number] DOB: [birth year]    ADMISSION DATE:  09/07/2017 CONSULTATION DATE: 08/31/2017 REFERRING MD : Barton Dubois  CHIEF COMPLAINT: Dyspnea  BRIEF PATIENT DESCRIPTION:   83 year old male w/ recent dx of lung mass.  Presented w/ progressive Dyspnea requiring intubation for work of breathing.    Per Dr. Elsworth Soho the plan was for possible note 08/19/2017 CT guided biopsy bronchoscopy with endobronchial ultrasound for subcarinal lymph node biopsy.  These modalities will be evaluated this time.  With his dyspnea tolerate a fiberoptic bronchoscopy at this time.  We will focus on improving his pulmonary status before invasive procedures are attempted.  SIGNIFICANT EVENTS  Been to [facility] on 2017/10/06 for shortness of breath  STUDIES:  CT of the chest right lower lobe mass and small pleural effusion.   SUBJECTIVE:  Self extubated this a.m.  VITAL SIGNS: Temp:  [98.1 F (36.7 C)-99.8 F (37.7 C)] 98.5 F (36.9 C) (01/04 0820) Pulse Rate:  [100-125] 108 (01/04 0736) Resp:  [18-26] 24 (01/04 0736) BP: (106-175)/(52-85) 135/74 (01/04 0700) SpO2:  [90 %-97 %] 96 % (01/04 0832) FiO2 (%):  [40 %-90 %] 90 % (01/04 0832) Weight:  [192 lb 0.3 oz (87.1 kg)] 192 lb 0.3 oz (87.1 kg) (01/04 0347)  PHYSICAL EXAMINATION: General: 83 year old white male now status post self extubation resting comfortably on high flow oxygen however does have some accessory muscle use with forced exhalation phase HEENT: Normocephalic atraumatic no jugular venous distention mucous membranes are moist sclera anicteric Pulmonary: As noted above some accessory muscle use prolonged expiratory phase with diffuse wheeze Cardiac: Tachycardic, regular rate and rhythm no murmur rub or gallop Abdomen: Soft nontender no organomegaly Extremities/musculoskeletal: Warm, dry, no edema, brisk cap refill, strong pulses Neuro/psych: Calm, appropriate, moves all extremities no weakness    Recent  Labs  Lab 09/17/2017 1102 08/31/17 0417 09/02/17 0332  NA 134* 134* 138  K 3.7 4.5 2.9*  CL 95* 97* 105  CO2 27 25 24   BUN 16 17 23*  CREATININE 0.87 0.81 1.08  GLUCOSE 186* 203* 186*   Recent Labs  Lab 09/14/2017 1102 08/31/17 0417 09/02/17 0332  HGB 13.1 12.3* 10.5*  HCT 39.5 37.2* 32.6*  WBC 19.7* 19.4* 25.2*  PLT 337 293 279   CBG (last 3)  Recent Labs    09/01/17 2314 09/02/17 0324 09/02/17 0741  GLUCAP 116* 165* 139*   Dg Abd 1 View  Result Date: 08/31/2017 CLINICAL DATA:  OG tube placement. EXAM: ABDOMEN - 1 VIEW COMPARISON:  None. FINDINGS: OG tube appears adequately positioned in the stomach. Proximal side holes are approximately 5 cm below the gastroesophageal junction. Visualized bowel gas pattern is nonobstructive. IMPRESSION: Enteric tube appears adequately positioned in the stomach. Electronically Signed   By: Franki Cabot M.D.   On: 08/31/2017 19:14   [place] Chest (pleural Effusion)  Result Date: 08/31/2017 CLINICAL DATA:  SMALL RIGHT PLEURAL EFFUSION EXAM: CHEST ULTRASOUND COMPARISON:  09/18/2017 FINDINGS: Ultrasound performed the right chest with the patient right side up decubitus. Small right effusion noted. There is not enough fluid to warrant thoracentesis for therapeutic purposes. Procedure not performed. IMPRESSION: Small right pleural effusion.  Thoracentesis not performed. Electronically Signed   By: Jerilynn Mages.  Shick M.D.   On: 08/31/2017 10:55   Dg Chest Port 1 View  Result Date: 09/01/2017 CLINICAL DATA:  Intubation. EXAM: PORTABLE CHEST 1 VIEW COMPARISON:  08/31/2017.  CT 09/19/2017. FINDINGS: Endotracheal tube and NG tube in stable position. Mediastinal fullness consistent  with known adenopathy noted. Right hilar/infrahilar mass again noted. No pleural effusion or pneumothorax. IMPRESSION: 1. Lines and tubes stable position. 2. Findings consist with mediastinal adenopathy and right hilar/ infrahilar mass again noted. No interim change from prior exams.  Electronically Signed   By: Marcello Moores  Register   On: 09/01/2017 06:18   Dg Chest Port 1 View  Result Date: 08/31/2017 CLINICAL DATA:  Endotracheal tube EXAM: PORTABLE CHEST 1 VIEW COMPARISON:  Chest x-ray from earlier same day. Chest CT dated 09/20/2017. FINDINGS: Endotracheal tube appears well positioned with tip approximately 3 cm above the carina. Heart size and mediastinal contours are stable. Right perihilar/infrahilar mass is stable in the short-term interval. No new lung findings. No pneumothorax seen. IMPRESSION: Endotracheal tube appears well positioned with tip approximately 3 cm above the carina. No other interval change compared to today's earlier chest x-ray and CT findings. Electronically Signed   By: Franki Cabot M.D.   On: 08/31/2017 19:14   Dg Chest Port 1 View  Result Date: 08/31/2017 CLINICAL DATA:  Respiratory distress.  History of lung cancer. EXAM: PORTABLE CHEST 1 VIEW COMPARISON:  Chest radiographs and CT 09/17/2017 FINDINGS: The cardiac silhouette is normal in size. Right paratracheal and hilar density corresponds to lymphadenopathy demonstrated on CT. Medial right lung base opacity is unchanged from the recent radiographs and corresponds to the known right lower lobe mass and right middle lobe collapse. A calcified granuloma is again noted in the left lung, which is otherwise clear. No large pleural effusion or pneumothorax is identified. IMPRESSION: Unchanged appearance of the chest. Persistent right middle lobe collapse in the setting of right lower lobe mass and lymphadenopathy. Electronically Signed   By: Logan Bores M.D.   On: 08/31/2017 14:14    ASSESSMENT/plan Summary: 83 year old male patient with newly identified right lower lobe lung mass as well as extensive mediastinal and hilar adenopathy resulting in airway compression, further complicated by probable postobstructive pneumonia.  He was intubated on 1/2 for acute respiratory failure, he has been treated with empiric  antibiotics, systemic steroids, and inhaled bronchodilators.  He was actually weaning this a.m. however there was concern about how much positive pressure was contributing to airway stenting given his known extensive adenopathy so he was undergoing spontaneous breathing trial on flow by.  He self extubated this morning and we have therefore placed him on high flow oxygen.  He appears comfortable now however he is at high risk for reintubation.  He needs a tissue diagnosis, EBUS is planned for 1/7.    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 Still w/ sig RML/RLL airspace disease.  -Self extubated this a.m. 1/4 while cycling on spontaneous breathing trial Plan Continue high flow oxygen, hope additional flow rate will provide adequate airway stenting Aspiration precautions Day #4 cefepime, will follow cultures and narrow as indicated Continue systemic steroids Continue scheduled inhaled bronchodilators Plan for EBUS Monday 1/7 Close observation in the intensive care, high risk for reintubation  H/o bladder cancer s/p radical cystectomy w/ urinary diversion Plan Continue CIC catheterization per nursing staff every 4-6 hours   Fluid and electrolyte imbalance: Hypokalemia Plan Replace and recheck  Anemia of Chronic disease Has had some hemoglobin drift however looks like this is hemodilution  Plan Continue subcu heparin Follow-up a.m. CBC  Leukocytosis  -suspect steroid related Plan Day #4 antibiotics, culture data negative Continue to trend CBC  Hyperglycemia  Plan ssi  Hold basal dosing   DVT prophylaxis: [place] heparin SUP: ppi  Diet: tube feeds, now n.p.o. Activity:  BR Disposition : ICU  My critical care time 45 minutes  Erick Colace ACNP-BC [place] Pager # [phone] OR # [phone] if no answer

## 2017-09-02 NOTE — Progress Notes (Signed)
[facility] ADULT ICU REPLACEMENT PROTOCOL FOR AM LAB REPLACEMENT ONLY  The patient does apply for the [facility] Adult ICU Electrolyte Replacment Protocol based on the criteria listed below:   1. Is GFR >/= 40 ml/min? Yes.    Patient's GFR today is 0.95 2. Is urine output >/= 0.5 ml/kg/hr for the last 6 hours? Yes.   Patient's UOP is 0.95 ml/kg/hr 3. Is BUN < 60 mg/dL? Yes.    Patient's BUN today is 23 4. Abnormal electrolyte  K 2.9 5. Ordered repletion with: per protocol 6. If a panic level lab has been reported, has the CCM MD in charge been notified? Yes.  .   Physician:  [place], [place] 09/02/2017 6:02 AM

## 2017-09-02 NOTE — Progress Notes (Signed)
Palliative:  Henry [name] is sedated on vent. He has had a difficult day with self extubation and requiring reintubation. I met with his son and daughter today (his wife has left the hospital for now). Introduced myself and palliative care. They are awaiting for biopsy to be performed (planned for Monday). They are awaiting for biopsy results to find out treatment options. Emotional support provided.   15 min  Vinie Sill, NP Palliative Medicine Team Pager # [phone] (M-F 8a-5p) Team Phone # [phone] (Nights/Weekends)

## 2017-09-02 NOTE — Progress Notes (Signed)
Patient apparently self extubated on 09/02/17 , was reintubated on 09/02/17 due to respiratory failure scheduled for EBUS 09/26/2017 7.30am -> Dr [place] Bells   Pt was neither seen nor evaluated by hospitalist service today, PCCM service was notify [facility] hospitalist service when they are ready for patient to be transferred back to our service  Hospitalist  service will sign off at this time  Roxan Hockey, MD

## 2017-09-02 NOTE — Procedures (Signed)
Intubation Procedure Note Henry [name] [id number] 17-May-1935  Procedure: Intubation Indications: Respiratory insufficiency  Procedure Details Consent: Risks of procedure as well as the alternatives and risks of each were explained to the (patient/caregiver).  Consent for procedure obtained. Time Out: Verified patient identification, verified procedure, site/side was marked, verified correct patient position, special equipment/implants available, medications/allergies/relevent history reviewed, required imaging and test results available.  Performed  Maximum sterile technique was used including antiseptics, cap, gloves, hand hygiene and mask.  MAC and 4    Evaluation Hemodynamic Status: BP stable throughout; O2 sats: stable throughout Patient's Current Condition: stable Complications: No apparent complications Patient did tolerate procedure well. Chest X-ray ordered to verify placement.  CXR: pending.  Erick Colace ACNP-BC [place] Pager # [phone] OR # [phone] if no answer  Clementeen Graham 09/02/2017

## 2017-09-02 NOTE — Progress Notes (Addendum)
Nutrition Follow-up  DOCUMENTATION CODES:   Not applicable  INTERVENTION:   Vital High Protein @ 50 ml/hr Provides: 1200 kcals (1614 kcal with propofol), 105 grams protein, 1008 ml free water. Meets 97% of calorie needs and 100% of protein needs.    NUTRITION DIAGNOSIS:   Increased nutrient needs related to cancer and cancer related treatments, catabolic illness as evidenced by estimated needs.  Ongoing  GOAL:   Patient will meet greater than or equal to 90% of their needs  Not meeting- to restart TF  MONITOR:   PO intake, Supplement acceptance, Weight trends, I & O's  REASON FOR ASSESSMENT:   Malnutrition Screening Tool    ASSESSMENT:   Pt with PMH of DM, GERD, HLD, HTN, and newly diagnosed lung cancer presents with chest pain and SOB found acute respiratory failure with hypoxia    Attempted extubation today, failed high flow. Tube feedings discontinued. Pt re-intubated. RD consulted for tube feeding recommendation. Adjusted needs according to vent setting. Pt remains on propofol. Plan to keep pt sedated until bronchoscopy. Will re order TF.   Patient is currently intubated on ventilator support MV: 10.9 L/min Temp (24hrs), Avg:98.9 F (37.2 C), Min:97.8 F (36.6 C), Max:99.8 F (37.7 C) BP 159/78 MAP: 106 Propofol: 15.7 ml/hr- provides 414 kcal  Medications reviewed and include: SSI, solumedrol, IV abx, D5@ 50 ml/hr, 10 mEq KCl, propofol Labs reviewed: K 2.9 (L)  Diet Order:  Diet NPO time specified  EDUCATION NEEDS:   No education needs have been identified at this time  Skin:  Skin Assessment: Reviewed RN Assessment  Last BM:  08/29/17  Height:   Ht Readings from Last 1 Encounters:  08/31/17 5\' 6"  (1.676 m)    Weight:   Wt Readings from Last 1 Encounters:  09/02/17 192 lb 0.3 oz (87.1 kg)    Ideal Body Weight:  64.5 kg  BMI:  Body mass index is 30.99 kg/m.  Estimated Nutritional Needs:   Kcal:  9323 (PSU)  Protein:  100-110 grams  (1.2-1.3 g/kg)  Fluid:  >1.6 L/day    Mariana Single RD, LDN Clinical Nutrition Pager # - [phone]

## 2017-09-02 NOTE — Progress Notes (Signed)
Progressive increase in WOB Failed high flow attempt Re-intubated.  Will need to be sedated until he can get bronch. Doubt extubated until cancer burden decreased.   Erick Colace ACNP-BC [place] Pager # [phone] OR # [phone] if no answer

## 2017-09-03 ENCOUNTER — Inpatient Hospital Stay (HOSPITAL_COMMUNITY): Payer: Medicare Other

## 2017-09-03 LAB — CBC
HEMATOCRIT: 34 % — AB (ref 39.0–52.0)
HEMOGLOBIN: 10.7 g/dL — AB (ref 13.0–17.0)
MCH: 27.9 pg (ref 26.0–34.0)
MCHC: 31.5 g/dL (ref 30.0–36.0)
MCV: 88.5 fL (ref 78.0–100.0)
Platelets: 254 10*3/uL (ref 150–400)
RBC: 3.84 MIL/uL — ABNORMAL LOW (ref 4.22–5.81)
RDW: 15.3 % (ref 11.5–15.5)
WBC: 23 10*3/uL — AB (ref 4.0–10.5)

## 2017-09-03 LAB — PHOSPHORUS
PHOSPHORUS: 3.8 mg/dL (ref 2.5–4.6)
Phosphorus: 4.1 mg/dL (ref 2.5–4.6)

## 2017-09-03 LAB — GLUCOSE, CAPILLARY
GLUCOSE-CAPILLARY: 195 mg/dL — AB (ref 65–99)
Glucose-Capillary: 168 mg/dL — ABNORMAL HIGH (ref 65–99)
Glucose-Capillary: 166 mg/dL — ABNORMAL HIGH (ref 65–99)
Glucose-Capillary: 147 mg/dL — ABNORMAL HIGH (ref 65–99)
Glucose-Capillary: 164 mg/dL — ABNORMAL HIGH (ref 65–99)
Glucose-Capillary: 172 mg/dL — ABNORMAL HIGH (ref 65–99)

## 2017-09-03 LAB — BASIC METABOLIC PANEL
ANION GAP: 8 (ref 5–15)
Anion gap: 8 (ref 5–15)
BUN: 23 mg/dL — AB (ref 6–20)
BUN: 24 mg/dL — ABNORMAL HIGH (ref 6–20)
CHLORIDE: 110 mmol/L (ref 101–111)
CO2: 23 mmol/L (ref 22–32)
CO2: 23 mmol/L (ref 22–32)
Calcium: 9.3 mg/dL (ref 8.9–10.3)
Calcium: 9.1 mg/dL (ref 8.9–10.3)
Chloride: 110 mmol/L (ref 101–111)
Creatinine, Ser: 0.86 mg/dL (ref 0.61–1.24)
Creatinine, Ser: 0.99 mg/dL (ref 0.61–1.24)
GFR calc Af Amer: >60 mL/min (ref >60)
GFR calc Af Amer: >60 mL/min (ref >60)
GFR calc non Af Amer: >60 mL/min (ref >60)
GLUCOSE: 205 mg/dL — AB (ref 65–99)
Glucose, Bld: 165 mg/dL — ABNORMAL HIGH (ref 65–99)
POTASSIUM: 4 mmol/L (ref 3.5–5.1)
Potassium: 3.9 mmol/L (ref 3.5–5.1)
SODIUM: 141 mmol/L (ref 135–145)
Sodium: 141 mmol/L (ref 135–145)

## 2017-09-03 LAB — MAGNESIUM
Magnesium: 2.1 mg/dL (ref 1.7–2.4)
Magnesium: 2.2 mg/dL (ref 1.7–2.4)

## 2017-09-03 LAB — TROPONIN I: TROPONIN I: 0.03 ng/mL — AB (ref <0.03)

## 2017-09-03 LAB — TRIGLYCERIDES: TRIGLYCERIDES: 541 mg/dL — AB (ref <150)

## 2017-09-03 MED ORDER — CHLORHEXIDINE GLUCONATE CLOTH 2 % EX PADS
6.0000 | MEDICATED_PAD | Freq: Every day | CUTANEOUS | Status: DC
  Administered 2017-09-03 – 2017-09-08 (×6): 6 via TOPICAL

## 2017-09-03 MED ORDER — LACTATED RINGERS IV SOLN
INTRAVENOUS | Status: DC
  Administered 2017-09-03: 09:00:00 via INTRAVENOUS

## 2017-09-03 MED ORDER — LABETALOL HCL 5 MG/ML IV SOLN
20.0000 mg | Freq: Once | INTRAVENOUS | Status: AC
  Administered 2017-09-03: 20 mg via INTRAVENOUS
  Filled 2017-09-03: qty 4

## 2017-09-03 MED ORDER — METHYLPREDNISOLONE SODIUM SUCC 40 MG IJ SOLR
40.0000 mg | Freq: Two times a day (BID) | INTRAMUSCULAR | Status: DC
  Administered 2017-09-03 – 2017-09-04 (×2): 40 mg via INTRAVENOUS
  Filled 2017-09-03 (×2): qty 1

## 2017-09-03 NOTE — Progress Notes (Signed)
Patient had run of SVT, HR in the 180's.  MD made aware. Verbal orders provided to draw labs: Mag, Phos, BMET, Troponin x3.

## 2017-09-03 NOTE — Progress Notes (Signed)
PCCM Interval Note   Patient self-extubated. Currently protecting airway and saturating well on Nasal Cannula. Called and spoke with wife and she verifies that if patient becomes distressed she would wish to proceed with reintubation.   Hayden Pedro, AGACNP-BC [facility]  Pgr: [phone]  PCCM Pgr: [phone]

## 2017-09-03 NOTE — Progress Notes (Signed)
[facility] Progress Note Patient Name: Henry [name] DOB: [birth year] MRN: [id number]   Date of Service  09/03/2017  HPI/Events of Note  Hypertensive and tachycardic. Comfortable resp pattern on cameral eval, no evidence distress.   eICU Interventions  Labetalol 20mg  now.  Consider low dose narcotics if evidence resp distress, dyspnea.      Intervention Category Major Interventions: Hypertension - evaluation and management  Collene Gobble 09/03/2017, 11:42 PM

## 2017-09-03 NOTE — Progress Notes (Signed)
Name: Henry [name] MRN: [id number] DOB: 1934/10/20    ADMISSION DATE:  09/19/2017 CONSULTATION DATE: 08/31/2017 REFERRING MD : Barton Dubois  CHIEF COMPLAINT: Dyspnea  BRIEF PATIENT DESCRIPTION:   83 year old male w/ recent dx of lung mass.  Presented w/ progressive Dyspnea requiring intubation for work of breathing.    Per Dr. Elsworth Soho the plan was for possible note 08/19/2017 CT guided biopsy bronchoscopy with endobronchial ultrasound for subcarinal lymph node biopsy.  These modalities will be evaluated this time.  With his dyspnea tolerate a fiberoptic bronchoscopy at this time.  We will focus on improving his pulmonary status before invasive procedures are attempted.  SIGNIFICANT EVENTS  09/15/2017 - admit  Been to [facility] on 20-Sep-2017 for shortness of breath. CT of the chest right lower lobe mass and small pleural effusion. Airway compression 08/31/17 - intubated 1.4/19 self extubated and reintubated    SUBJECTIVE/OVERNIGHT/INTERVAL HX 09/03/2017 - improved to 40% fio2. No fever. On empiric abx and steroids; wheeze resolved . On EBUS schedule 09/11/2017 7.30 am (could not get equipment assembled for normal flex bronch 09/02/17)   VITAL SIGNS: Temp:  [97.5 F (36.4 C)-98.2 F (36.8 C)] 97.7 F (36.5 C) (01/05 0800) Pulse Rate:  [94-131] 94 (01/05 0730) Resp:  [14-26] 20 (01/05 0730) BP: (120-227)/(69-107) 148/73 (01/05 0600) SpO2:  [86 %-99 %] 95 % (01/05 0743) FiO2 (%):  [40 %-90 %] 40 % (01/05 0743) Weight:  [85.5 kg (188 lb 7.9 oz)] 85.5 kg (188 lb 7.9 oz) (01/05 0510)  PHYSICAL EXAMINATION:  General Appearance:    Looks criticall ill  Head:    Normocephalic, without obvious abnormality, atraumatic  Eyes:    PERRL - yes, conjunctiva/corneas - clear      Ears:    Normal external ear canals, both ears  Nose:   NG tube - yes  Throat:  ETT TUBE - yes , OG tube - no  Neck:   Supple,  No enlargement/tenderness/nodules     Lungs:     Clear to auscultation bilaterally,  Ventilator   Synchrony - yes. No wheeze  Chest wall:    No deformity  Heart:    S1 and S2 normal, no murmur, CVP - no.  Pressors - no  Abdomen:     Soft, no masses, no organomegaly  Genitalia:    Not done  Rectal:   not done  Extremities:   Extremities- intact     Skin:   Intact in exposed areas . Sacral area - no decub reported     Neurologic:   Sedation - diprivan -> RASS - -2 . Moves all 4s - not right now but during [facility] yes. CAM-ICU - neg per RN . Orientation - yes per RN      PULMONARY Recent Labs  Lab 08/31/17 1425 08/31/17 1905 09/02/17 1351  PHART 7.369 7.443 7.272*  PCO2ART 44.8 38.8 54.7*  PO2ART 60.2* 131* 112*  HCO3 25.2 26.1 24.4  O2SAT 87.9 98.7 96.9    CBC Recent Labs  Lab 08/31/17 0417 09/02/17 0332 09/03/17 0448  HGB 12.3* 10.5* 10.7*  HCT 37.2* 32.6* 34.0*  WBC 19.4* 25.2* 23.0*  PLT 293 279 254    COAGULATION No results for input(s): INR in the last 168 hours.  CARDIAC   Recent Labs  Lab 09/01/17 0319  TROPONINI 0.05*   No results for input(s): PROBNP in the last 168 hours.   CHEMISTRY Recent Labs  Lab 09/26/2017 1102 08/31/17 0417  09/01/17 1958 09/02/17 0332 09/02/17  1245 09/02/17 1630 09/02/17 2047 09/03/17 0448  NA 134* 134*  --   --  138  --   --  138 141  K 3.7 4.5  --   --  2.9*  --   --  3.9 4.0  CL 95* 97*  --   --  105  --   --  107 110  CO2 27 25  --   --  24  --   --  23 23  GLUCOSE 186* 203*  --   --  186*  --   --  197* 205*  BUN 16 17  --   --  23*  --   --  25* 23*  CREATININE 0.87 0.81  --   --  1.08  --   --  0.95 0.86  CALCIUM 9.8 9.5  --   --  9.1  --   --  9.0 9.3  MG  --   --    < > 2.0 2.1 2.0 2.0  --  2.1  PHOS  --   --    < > 3.4 3.9 4.7* 4.1  --  3.8   < > = values in this interval not displayed.   Estimated Creatinine Clearance: 70.3 mL/min (by C-G formula based on SCr of 0.86 mg/dL).   LIVER Recent Labs  Lab 08/31/17 0417  AST 21  ALT 17  ALKPHOS 151*  BILITOT 0.9  PROT 6.5  ALBUMIN  3.3*     INFECTIOUS Recent Labs  Lab 09/04/2017 1525 08/31/17 1349 09/01/17 0319 09/02/17 0332  LATICACIDVEN 2.98* 2.1* 2.6*  --   PROCALCITON  --  0.19 0.12 0.11     ENDOCRINE CBG (last 3)  Recent Labs    09/02/17 2308 09/03/17 0320 09/03/17 0734  GLUCAP 155* 195* 168*         IMAGING x48h  - image(s) personally visualized  -   highlighted in bold Dg Chest Port 1 View  Result Date: 09/03/2017 CLINICAL DATA:  Pneumonia, diabetes. EXAM: PORTABLE CHEST 1 VIEW COMPARISON:  09/02/2017. FINDINGS: Normal cardiac size. Tubes and lines appear stable. RIGHT perihilar opacity is unchanged. IMPRESSION: Stable exam. Electronically Signed   By: Staci Righter M.D.   On: 09/03/2017 07:31   Portable Chest Xray  Result Date: 09/02/2017 CLINICAL DATA:  Intubated, enteric tube placement EXAM: PORTABLE CHEST 1 VIEW COMPARISON:  Chest radiograph from one day prior. FINDINGS: Endotracheal tube tip is 3.7 cm above the carina. Enteric tube enters stomach with the tip not seen on this image. Right PICC terminates at the cavoatrial junction. Stable cardiomediastinal silhouette with normal heart size and enlargement of the lower right hilum. The lower trachea and mainstem bronchi appeared diminutive, unchanged. No pneumothorax. Trace bilateral pleural effusions, stable. Stable right lower lobe lung mass and right middle lobe atelectasis. Stable left lung base atelectasis. No pulmonary edema. IMPRESSION: 1. Well-positioned lines and tubes. 2. Stable diminutive appearing lower trachea and mainstem bronchi due to known extrinsic compression by bulky mediastinal adenopathy. 3. Stable right middle lobe atelectasis obscuring known right lower lobe lung mass. 4. Stable trace bilateral pleural effusions and left basilar atelectasis. Electronically Signed   By: Ilona Sorrel M.D.   On: 09/02/2017 12:55      ASSESSMENT/plan Summary: 83 year old male patient with newly identified right lower lobe lung mass as well  as extensive mediastinal and hilar adenopathy resulting in airway compression, further complicated by probable postobstructive pneumonia.   Acute Respiratory failure  In setting  of RLL  Lung mass w/extensive mediastinal and hilar adenopathy +/- post-obstructive PNA which is unlikely but cannot rule out   - 09/03/2017 - > does not meet criteria for SBT/Extubation in setting of Acute Respiratory Failure due to airway compression   PLAN l Plan ABx as below Full vent support Plan for EBUS 09/15/17 7.30 am but possible Dr Lake Bells able to do bedside bronch withg bx    H/o bladder cancer s/p radical cystectomy w/ urinary diversion Plan Continue CIC catheterization per nursing staff every 4-6 hours   Fluid and electrolyte imbalance: Normal 09/03/2017 Plan monitor  Anemia of Chronic disease    - stable  Plan Continue subcu heparin Follow-up a.m. CBC - PRBC for hgb </= 6.9gm%    - exceptions are   -  if ACS susepcted/confirmed then transfuse for hgb </= 8.0gm%,  or    -  active bleeding with hemodynamic instability, then transfuse regardless of hemoglobin value   At at all times try to transfuse 1 unit prbc as possible with exception of active hemorrhage '    Hyperglycemia  Plan ssi  Hold basal dosing   DVT prophylaxis: [place] heparin SUP: ppi  Diet: tube feeds,  Activity: BR Disposition : ICU Family : none at bedside    The patient is critically ill with multiple organ systems failure and requires high complexity decision making for assessment and support, frequent evaluation and titration of therapies, application of advanced monitoring technologies and extensive interpretation of multiple databases.   Critical Care Time devoted to patient care services described in this note is  30  Minutes. This time reflects time of care of this signee Dr Brand Males. This critical care time does not reflect procedure time, or teaching time or supervisory time of PA/NP/Med student/Med  Resident etc but could involve care discussion time    Dr. Brand Males, M.D., [facility].C.P Pulmonary and Critical Care Medicine Staff Physician [place] [facility]monary and Critical [facility] Pager: [phone], If no answer or between  15:00h - 7:00h: call [phone]  09/03/2017 8:31 AM

## 2017-09-04 ENCOUNTER — Inpatient Hospital Stay (HOSPITAL_COMMUNITY): Payer: Medicare Other

## 2017-09-04 ENCOUNTER — Encounter (HOSPITAL_COMMUNITY): Payer: Self-pay | Admitting: Certified Registered Nurse Anesthetist

## 2017-09-04 ENCOUNTER — Other Ambulatory Visit: Payer: Self-pay

## 2017-09-04 LAB — BASIC METABOLIC PANEL
ANION GAP: 8 (ref 5–15)
BUN: 29 mg/dL — ABNORMAL HIGH (ref 6–20)
CALCIUM: 9.3 mg/dL (ref 8.9–10.3)
CHLORIDE: 109 mmol/L (ref 101–111)
CO2: 23 mmol/L (ref 22–32)
Creatinine, Ser: 0.97 mg/dL (ref 0.61–1.24)
GFR calc non Af Amer: >60 mL/min (ref >60)
GLUCOSE: 193 mg/dL — AB (ref 65–99)
POTASSIUM: 4 mmol/L (ref 3.5–5.1)
Sodium: 140 mmol/L (ref 135–145)

## 2017-09-04 LAB — TROPONIN I
TROPONIN I: 0.03 ng/mL — AB (ref <0.03)
Troponin I: 0.04 ng/mL (ref <0.03)
Troponin I: 0.03 ng/mL (ref <0.03)
Troponin I: <0.03 ng/mL (ref <0.03)

## 2017-09-04 LAB — CULTURE, BLOOD (ROUTINE X 2)
Culture: NO GROWTH
Culture: NO GROWTH
Special Requests: ADEQUATE

## 2017-09-04 LAB — CBC WITH DIFFERENTIAL/PLATELET
BAND NEUTROPHILS: 9 %
BLASTS: 0 %
Basophils Absolute: 0 10*3/uL (ref 0.0–0.1)
Basophils Relative: 0 %
EOS ABS: 0 10*3/uL (ref 0.0–0.7)
Eosinophils Relative: 0 %
HEMATOCRIT: 37 % — AB (ref 39.0–52.0)
Hemoglobin: 11.8 g/dL — ABNORMAL LOW (ref 13.0–17.0)
LYMPHS PCT: 11 %
Lymphs Abs: 3.9 10*3/uL (ref 0.7–4.0)
MCH: 28.6 pg (ref 26.0–34.0)
MCHC: 31.9 g/dL (ref 30.0–36.0)
MCV: 89.6 fL (ref 78.0–100.0)
Metamyelocytes Relative: 1 %
Monocytes Absolute: 0.7 10*3/uL (ref 0.1–1.0)
Monocytes Relative: 2 %
Myelocytes: 3 %
Neutro Abs: 30.9 10*3/uL — ABNORMAL HIGH (ref 1.7–7.7)
Neutrophils Relative %: 74 %
OTHER: 0 %
PROMYELOCYTES ABS: 0 %
Platelets: 312 10*3/uL (ref 150–400)
RBC: 4.13 MIL/uL — AB (ref 4.22–5.81)
RDW: 15.6 % — ABNORMAL HIGH (ref 11.5–15.5)
WBC: 35.5 10*3/uL — AB (ref 4.0–10.5)
nRBC: 0 /100 WBC

## 2017-09-04 LAB — GLUCOSE, CAPILLARY
GLUCOSE-CAPILLARY: 80 mg/dL (ref 65–99)
Glucose-Capillary: 86 mg/dL (ref 65–99)
Glucose-Capillary: 111 mg/dL — ABNORMAL HIGH (ref 65–99)
Glucose-Capillary: 130 mg/dL — ABNORMAL HIGH (ref 65–99)
Glucose-Capillary: 148 mg/dL — ABNORMAL HIGH (ref 65–99)
Glucose-Capillary: 144 mg/dL — ABNORMAL HIGH (ref 65–99)

## 2017-09-04 MED ORDER — LABETALOL HCL 5 MG/ML IV SOLN
20.0000 mg | Freq: Four times a day (QID) | INTRAVENOUS | Status: DC | PRN
  Administered 2017-09-09: 20 mg via INTRAVENOUS
  Filled 2017-09-04: qty 4

## 2017-09-04 MED ORDER — ZOLPIDEM TARTRATE 5 MG PO TABS
5.0000 mg | ORAL_TABLET | Freq: Once | ORAL | Status: AC
  Administered 2017-09-04: 5 mg via ORAL
  Filled 2017-09-04: qty 1

## 2017-09-04 MED ORDER — METHYLPREDNISOLONE SODIUM SUCC 40 MG IJ SOLR: 40.0000 mg | INTRAMUSCULAR | Status: DC

## 2017-09-04 MED ORDER — PANTOPRAZOLE SODIUM 40 MG PO TBEC
40.0000 mg | DELAYED_RELEASE_TABLET | Freq: Every day | ORAL | Status: DC
  Administered 2017-09-04 – 2017-09-05 (×2): 40 mg via ORAL
  Filled 2017-09-04 (×3): qty 1

## 2017-09-04 MED ORDER — DEXTROSE-NACL 5-0.45 % IV SOLN
INTRAVENOUS | Status: DC
  Administered 2017-09-04: 12:00:00 via INTRAVENOUS

## 2017-09-04 MED ORDER — NICARDIPINE HCL IN NACL 20-0.86 MG/200ML-% IV SOLN
3.0000 mg/h | INTRAVENOUS | Status: DC
  Administered 2017-09-04 (×3): 5 mg/h via INTRAVENOUS
  Filled 2017-09-04 (×3): qty 200

## 2017-09-04 MED ORDER — NICARDIPINE HCL IN NACL 20-0.86 MG/200ML-% IV SOLN
3.0000 mg/h | INTRAVENOUS | Status: DC
  Administered 2017-09-04 (×3): 5 mg/h via INTRAVENOUS
  Administered 2017-09-05: 7.5 mg/h via INTRAVENOUS
  Administered 2017-09-05: 10 mg/h via INTRAVENOUS
  Administered 2017-09-05: 7.5 mg/h via INTRAVENOUS
  Filled 2017-09-04 (×7): qty 200

## 2017-09-04 MED ORDER — LABETALOL HCL 5 MG/ML IV SOLN: 20.0000 mg | Freq: Four times a day (QID) | INTRAVENOUS | Status: DC | PRN

## 2017-09-04 MED ORDER — NITROGLYCERIN IN D5W 200-5 MCG/ML-% IV SOLN
0.0000 ug/min | INTRAVENOUS | Status: DC
  Administered 2017-09-04: 5 ug/min via INTRAVENOUS
  Filled 2017-09-04: qty 250

## 2017-09-04 MED ORDER — MORPHINE SULFATE (PF) 4 MG/ML IV SOLN
2.0000 mg | INTRAVENOUS | Status: AC
  Administered 2017-09-04: 2 mg via INTRAVENOUS
  Filled 2017-09-04: qty 1

## 2017-09-04 MED ORDER — ORAL CARE MOUTH RINSE
15.0000 mL | Freq: Two times a day (BID) | OROMUCOSAL | Status: DC
  Administered 2017-09-04: 15 mL via OROMUCOSAL

## 2017-09-04 MED ORDER — METHYLPREDNISOLONE SODIUM SUCC 125 MG IJ SOLR
60.0000 mg | Freq: Two times a day (BID) | INTRAMUSCULAR | Status: DC
  Administered 2017-09-04 – 2017-09-07 (×6): 60 mg via INTRAVENOUS
  Filled 2017-09-04 (×6): qty 2

## 2017-09-04 MED ORDER — HYDRALAZINE HCL 20 MG/ML IJ SOLN: 10.0000 mg | Freq: Four times a day (QID) | INTRAMUSCULAR | Status: DC | PRN

## 2017-09-04 MED ORDER — ORAL CARE MOUTH RINSE: 15.0000 mL | Freq: Two times a day (BID) | OROMUCOSAL | Status: DC

## 2017-09-04 MED ORDER — CHLORHEXIDINE GLUCONATE 0.12 % MT SOLN: 15.0000 mL | Freq: Two times a day (BID) | OROMUCOSAL | Status: DC

## 2017-09-04 MED ORDER — CHLORHEXIDINE GLUCONATE 0.12 % MT SOLN
15.0000 mL | Freq: Two times a day (BID) | OROMUCOSAL | Status: DC
  Administered 2017-09-04: 15 mL via OROMUCOSAL
  Filled 2017-09-04: qty 15

## 2017-09-04 NOTE — Anesthesia Preprocedure Evaluation (Addendum)
Anesthesia Evaluation  Patient identified by MRN, date of birth, ID band Patient awake    Reviewed: Allergy & Precautions, H&P , Patient's Chart, lab work & pertinent test results, reviewed documented beta blocker date and time   Airway Mallampati: II  TM Distance: >3 FB Neck ROM: full    Dental no notable dental hx.    Pulmonary    + rhonchi  + wheezing      Cardiovascular  Rhythm:regular Rate:Normal     Neuro/Psych    GI/Hepatic   Endo/Other  diabetes  Renal/GU      Musculoskeletal   Abdominal   Peds  Hematology   Anesthesia Other Findings Multiple intubations and self-extubations Currently extubated HTN difficult to control   Reproductive/Obstetrics                             Anesthesia Physical Anesthesia Plan  ASA: IV  Anesthesia Plan: General   Post-op Pain Management:    Induction: Intravenous  PONV Risk Score and Plan:   Airway Management Planned: Oral ETT  Additional Equipment:   Intra-op Plan:   Post-operative Plan: Post-operative intubation/ventilation  Informed Consent: I have reviewed the patients History and Physical, chart, labs and discussed the procedure including the risks, benefits and alternatives for the proposed anesthesia with the patient or authorized representative who has indicated his/her understanding and acceptance.   Dental Advisory Given  Plan Discussed with: CRNA and Surgeon  Anesthesia Plan Comments: ( Discussed plan with Ruby Cola; we'll plan to leave intubated )       Anesthesia Quick Evaluation

## 2017-09-04 NOTE — Progress Notes (Signed)
[place] Progress Note Patient Name: Henry [name] DOB: [birth year] MRN: [id number]   Date of Service  09/04/2017  HPI/Events of Note  Patient complained of dyspnea while on low dose NTG, was stopped. Current BP 204/114  eICU Interventions  - ECG now - continue to cycle troponin - stop NTG since he did not tolerate - nicardipine gtt, goal SBP 160 - keep labetalol and hydralazine available prn - morphine 2mg  to see if resp and cardiac status benefit - may be a candidate for BiPAP     Intervention Category Major Interventions: Hypertension - evaluation and management  Collene Gobble 09/04/2017, 3:33 AM

## 2017-09-04 NOTE — Progress Notes (Signed)
Name: Henry [name] MRN: [id number] DOB: [birth year]    ADMISSION DATE:  09/28/2017 CONSULTATION DATE: 08/31/2017 REFERRING MD : Barton Dubois  CHIEF COMPLAINT: Dyspnea  BRIEF PATIENT DESCRIPTION:   83 year old male w/ recent dx of lung mass.  Presented w/ progressive Dyspnea requiring intubation for work of breathing.    Per Dr. Elsworth Soho the plan was for possible note 08/19/2017 CT guided biopsy bronchoscopy with endobronchial ultrasound for subcarinal lymph node biopsy.  These modalities will be evaluated this time.  With his dyspnea tolerate a fiberoptic bronchoscopy at this time.  We will focus on improving his pulmonary status before invasive procedures are attempted.  SIGNIFICANT EVENTS  09/03/2017 - admit  Been to [facility] on 09/15/2017 for shortness of breath. CT of the chest right lower lobe mass and small pleural effusion. Airway compression 08/31/17 - intubated 09/02/17 self extubated and reintubated 09/03/2017 - improved to 40% fio2. No fever. On empiric abx and steroids; wheeze resolved . On EBUS schedule 09/04/2017 7.30 am (could not get equipment assembled for normal flex bronch 09/02/17) but Dr Lake Bells might try regular video flex bronch at bedside    SUBJECTIVE/OVERNIGHT/INTERVAL HX 09/04/17 - self extubated again. Holding own Was hypertensive post self extubation -> NTG and then cardene. Wanting ice   VITAL SIGNS: Temp:  [97.8 F (36.6 C)-98.8 F (37.1 C)] 97.8 F (36.6 C) (01/06 0719) Pulse Rate:  [93-134] 107 (01/06 1000) Resp:  [14-26] 17 (01/06 1000) BP: (127-245)/(68-212) 146/77 (01/06 1000) SpO2:  [91 %-100 %] 100 % (01/06 1000) FiO2 (%):  [40 %-100 %] 100 % (01/06 0914) Weight:  [83.9 kg (184 lb 15.5 oz)] 83.9 kg (184 lb 15.5 oz) (01/06 0500)  PHYSICAL EXAMINATION:    General Appearance:    Looks better  Head:    Normocephalic, without obvious abnormality, atraumatic  Eyes:    PERRL - yes, conjunctiva/corneas - clear      Ears:    Normal external ear canals,  both ears  Nose:   NG tube - no  Throat:  ETT TUBE - no , OG tube - no  Neck:   Supple,  No enlargement/tenderness/nodules     Lungs:      Noisy breathing but not labored  Chest wall:    No deformity  Heart:    S1 and S2 normal, no murmur, CVP - no.  Pressors - no  Abdomen:     Soft, no masses, no organomegaly  Genitalia:    Not done  Rectal:   not done  Extremities:   Extremities- intact     Skin:   Intact in exposed areas . Sacral area - no decub     Neurologic:   Sedation - none -> RASS - +1 . Moves all 4s - yes. CAM-ICU - neg . Orientation - x3+         PULMONARY Recent Labs  Lab 08/31/17 1425 08/31/17 1905 09/02/17 1351  PHART 7.369 7.443 7.272*  PCO2ART 44.8 38.8 54.7*  PO2ART 60.2* 131* 112*  HCO3 25.2 26.1 24.4  O2SAT 87.9 98.7 96.9    CBC Recent Labs  Lab 09/02/17 0332 09/03/17 0448 09/04/17 0402  HGB 10.5* 10.7* 11.8*  HCT 32.6* 34.0* 37.0*  WBC 25.2* 23.0* 35.5*  PLT 279 254 312    COAGULATION No results for input(s): INR in the last 168 hours.  CARDIAC   Recent Labs  Lab 09/01/17 0319 09/03/17 1642 09/03/17 2342 09/04/17 0402 09/04/17 0930  TROPONINI 0.05* 0.03* 0.03* 0.04* 0.03*  No results for input(s): PROBNP in the last 168 hours.   CHEMISTRY Recent Labs  Lab 09/02/17 0332 09/02/17 1245 09/02/17 1630 09/02/17 2047 09/03/17 0448 09/03/17 1642 09/03/17 2342  NA 138  --   --  138 141 141 140  K 2.9*  --   --  3.9 4.0 3.9 4.0  CL 105  --   --  107 110 110 109  CO2 24  --   --  23 23 23 23   GLUCOSE 186*  --   --  197* 205* 165* 193*  BUN 23*  --   --  25* 23* 24* 29*  CREATININE 1.08  --   --  0.95 0.86 0.99 0.97  CALCIUM 9.1  --   --  9.0 9.3 9.1 9.3  MG 2.1 2.0 2.0  --  2.1 2.2  --   PHOS 3.9 4.7* 4.1  --  3.8 4.1  --    Estimated Creatinine Clearance: 61.7 mL/min (by C-G formula based on SCr of 0.97 mg/dL).   LIVER Recent Labs  Lab 08/31/17 0417  AST 21  ALT 17  ALKPHOS 151*  BILITOT 0.9  PROT 6.5    ALBUMIN 3.3*     INFECTIOUS Recent Labs  Lab 09/18/2017 1525 08/31/17 1349 09/01/17 0319 09/02/17 0332  LATICACIDVEN 2.98* 2.1* 2.6*  --   PROCALCITON  --  0.19 0.12 0.11     ENDOCRINE CBG (last 3)  Recent Labs    09/03/17 2332 09/04/17 0302 09/04/17 0707  GLUCAP 172* 86 80         IMAGING x48h  - image(s) personally visualized  -   highlighted in bold Dg Chest Port 1 View  Result Date: 09/04/2017 CLINICAL DATA:  Endotracheal tube and NG tube removal. EXAM: PORTABLE CHEST 1 VIEW COMPARISON:  09/03/2017 and prior exams FINDINGS: The cardiomediastinal silhouette is unchanged. Mediastinal and right hilar fullness and poorly visualized right lower lung mass again identified. The endotracheal tube and NG tube have been removed. A right PICC line with tip overlying the lower SVC again noted. No pneumothorax or large pleural effusion. No other significant change. IMPRESSION: Endotracheal tube and NG tube removal without other significant change. Electronically Signed   By: Margarette Canada M.D.   On: 09/04/2017 07:27   Dg Chest Port 1 View  Result Date: 09/03/2017 CLINICAL DATA:  Pneumonia, diabetes. EXAM: PORTABLE CHEST 1 VIEW COMPARISON:  09/02/2017. FINDINGS: Normal cardiac size. Tubes and lines appear stable. RIGHT perihilar opacity is unchanged. IMPRESSION: Stable exam. Electronically Signed   By: Staci Righter M.D.   On: 09/03/2017 07:31   Portable Chest Xray  Result Date: 09/02/2017 CLINICAL DATA:  Intubated, enteric tube placement EXAM: PORTABLE CHEST 1 VIEW COMPARISON:  Chest radiograph from one day prior. FINDINGS: Endotracheal tube tip is 3.7 cm above the carina. Enteric tube enters stomach with the tip not seen on this image. Right PICC terminates at the cavoatrial junction. Stable cardiomediastinal silhouette with normal heart size and enlargement of the lower right hilum. The lower trachea and mainstem bronchi appeared diminutive, unchanged. No pneumothorax. Trace bilateral  pleural effusions, stable. Stable right lower lobe lung mass and right middle lobe atelectasis. Stable left lung base atelectasis. No pulmonary edema. IMPRESSION: 1. Well-positioned lines and tubes. 2. Stable diminutive appearing lower trachea and mainstem bronchi due to known extrinsic compression by bulky mediastinal adenopathy. 3. Stable right middle lobe atelectasis obscuring known right lower lobe lung mass. 4. Stable trace bilateral pleural effusions and left basilar  atelectasis. Electronically Signed   By: Ilona Sorrel M.D.   On: 09/02/2017 12:55      ASSESSMENT/plan Summary: 83 year old male patient with newly identified right lower lobe lung mass as well as extensive mediastinal and hilar adenopathy resulting in airway compression, further complicated by probable postobstructive pneumonia.   Acute Respiratory failure  In setting of RLL  Lung mass w/extensive mediastinal and hilar adenopathy +/- post-obstructive PNA which is unlikely but cannot rule out   - 09/04/2017 - >self extubated and holding own. Likely steroids helped him staty extubated   PLAN l Plan ABx as below Plan for EBUS 09/15/17 7.30 am but possible Dr Lake Bells likely able to do bedside bronch withg bx - d/w Junius [facility] RRT  To inform RT to discuss flex bronch need.  IV sterod due to wheeze   H/o bladder cancer s/p radical cystectomy w/ urinary diversion Plan Continue CIC catheterization per nursing staff every 4-6 hours    Fluid and electrolyte imbalance: Normal 09/04/2017 Plan monitor  Anemia of Chronic disease   - stable  Plan Continue subcu heparin Follow-up a.m. CBC - PRBC for hgb </= 6.9gm%    - exceptions are   -  if ACS susepcted/confirmed then transfuse for hgb </= 8.0gm%,  or    -  active bleeding with hemodynamic instability, then transfuse regardless of hemoglobin value   At at all times try to transfuse 1 unit prbc as possible with exception of active hemorrhage '    Hyperglycemia  Plan ssi    Hold basal dosing     DVT prophylaxis: [place] heparin SUP: ppi  Diet: tube feeds,  Activity: BR Disposition : ICU Family : son from [place] at bedside updated      The patient is critically ill with multiple organ systems failure and requires high complexity decision making for assessment and support, frequent evaluation and titration of therapies, application of advanced monitoring technologies and extensive interpretation of multiple databases.   Critical Care Time devoted to patient care services described in this note is  30  Minutes. This time reflects time of care of this signee Dr Brand Males. This critical care time does not reflect procedure time, or teaching time or supervisory time of PA/NP/Med student/Med Resident etc but could involve care discussion time    Dr. Brand Males, M.D., [facility].C.P Pulmonary and Critical Care Medicine Staff Physician [place] Pulmonary and Critical Care Pager: [phone], If no answer or between  15:00h - 7:00h: call [phone]  09/04/2017 12:28 PM

## 2017-09-04 NOTE — Progress Notes (Signed)
[place] Progress Note Patient Name: Henry [name] DOB: 1934-09-29 MRN: [id number]   Date of Service  09/04/2017  HPI/Events of Note  Patient request sleep aid.  eICU Interventions  Will order: 1. Ambien 5 mg PO X 1 now.      Intervention Category Major Interventions: Other:  Lysle Dingwall 09/04/2017, 9:42 PM

## 2017-09-04 NOTE — Progress Notes (Signed)
Nutrition Follow-up  INTERVENTION:   Diet advancement per MD  If re-intubated: Resume Vital HP @ 50 ml/hr. Provides 1200 kcals (1614 kcal with propofol), 105 grams protein, 1008 ml free water.  RD will continue to monitor for plan  NUTRITION DIAGNOSIS:   Increased nutrient needs related to cancer and cancer related treatments, catabolic illness as evidenced by estimated needs.  Ongoing.  GOAL:   Patient will meet greater than or equal to 90% of their needs  Not meeting.  MONITOR:   Diet advancement, Labs, Weight trends, I & O's  REASON FOR ASSESSMENT:   Consult Enteral/tube feeding initiation and management  ASSESSMENT:   Pt with PMH of DM, GERD, HLD, HTN, and newly diagnosed lung cancer presents with chest pain and SOB found acute respiratory failure with hypoxia   Events: 1/1: admitted for SOB. CT scan revealed lung mass and pleural effusion. 1/2: placed on BiPAP without any improvement, pt intubated to protect airway, bronchoscopy shows massive subcarinal mass. 1/4: self extubated, then was reintubated. TF was resumed: Vital HP @ 77ml/hr. 1/5: self extubated ~2214 per nursing notes  Pt currently NPO and not intubated. Recommendations for TF above if pt is to be re-intubated. Recommend resuming Ensure supplements if diet is advanced.  Labs reviewed. Medications: Protonix tablet daily, Lactated Ringers infusion at 10 ml/hr  Diet Order:  Diet NPO time specified  EDUCATION NEEDS:   No education needs have been identified at this time  Skin:  Skin Assessment: Reviewed RN Assessment  Last BM:  08/29/17  Height:   Ht Readings from Last 1 Encounters:  08/31/17 5\' 6"  (1.676 m)    Weight:   Wt Readings from Last 1 Encounters:  09/04/17 184 lb 15.5 oz (83.9 kg)    Ideal Body Weight:  64.5 kg  BMI:  Body mass index is 29.85 kg/m.  Estimated Nutritional Needs:   Kcal:  1800-2000  Protein:  100-110 grams (1.2-1.3 g/kg)  Fluid:  1.8L/day  Clayton Bibles, MS, RD, LDN [place] Dietitian Pager: [phone] After Hours Pager: [phone]

## 2017-09-04 NOTE — Progress Notes (Signed)
[place] Progress Note Patient Name: Henry [name] DOB: 1935-08-03 MRN: [id number]   Date of Service  09/04/2017  HPI/Events of Note  Remains hypertensive. Otherwise stable.  Add NTG gtt. May need to hold his other afterload reducers in the am if he remains on this  eICU Interventions       Intervention Category Major Interventions: Hypertension - evaluation and management  Collene Gobble 09/04/2017, 2:24 AM

## 2017-09-05 ENCOUNTER — Ambulatory Visit
Admission: RE | Admit: 2017-09-05 | Discharge: 2017-09-05 | Disposition: A | Discharge status: Home / Self Care | Payer: Medicare Other | Source: Ambulatory Visit | Attending: Radiation Oncology | Admitting: Radiation Oncology

## 2017-09-05 ENCOUNTER — Encounter (HOSPITAL_COMMUNITY): Admission: EM | Disposition: E | Payer: Self-pay | Source: Home / Self Care | Attending: Internal Medicine

## 2017-09-05 ENCOUNTER — Ambulatory Visit
Admit: 2017-09-05 | Discharge: 2017-09-05 | Disposition: A | Discharge status: Home / Self Care | Payer: Medicare Other | Attending: Radiation Oncology | Admitting: Radiation Oncology

## 2017-09-05 ENCOUNTER — Inpatient Hospital Stay (HOSPITAL_COMMUNITY): Payer: Medicare Other

## 2017-09-05 ENCOUNTER — Encounter (HOSPITAL_COMMUNITY): Payer: Self-pay

## 2017-09-05 ENCOUNTER — Inpatient Hospital Stay (HOSPITAL_COMMUNITY): Payer: Medicare Other | Admitting: Anesthesiology

## 2017-09-05 DIAGNOSIS — R918 Other nonspecific abnormal finding of lung field: Secondary | ICD-10-CM

## 2017-09-05 DIAGNOSIS — Z510 Encounter for antineoplastic radiation therapy: Secondary | ICD-10-CM | POA: Insufficient documentation

## 2017-09-05 DIAGNOSIS — C3431 Malignant neoplasm of lower lobe, right bronchus or lung: Secondary | ICD-10-CM | POA: Insufficient documentation

## 2017-09-05 DIAGNOSIS — C3400 Malignant neoplasm of unspecified main bronchus: Secondary | ICD-10-CM

## 2017-09-05 HISTORY — PX: ENDOBRONCHIAL ULTRASOUND: SHX5096

## 2017-09-05 LAB — BASIC METABOLIC PANEL
Anion gap: 9 (ref 5–15)
BUN: 45 mg/dL — AB (ref 6–20)
CHLORIDE: 115 mmol/L — AB (ref 101–111)
CO2: 21 mmol/L — AB (ref 22–32)
CREATININE: 1.1 mg/dL (ref 0.61–1.24)
Calcium: 9.3 mg/dL (ref 8.9–10.3)
GFR calc Af Amer: >60 mL/min (ref >60)
GFR calc non Af Amer: >60 mL/min (ref >60)
Glucose, Bld: 176 mg/dL — ABNORMAL HIGH (ref 65–99)
Potassium: 4.2 mmol/L (ref 3.5–5.1)
SODIUM: 145 mmol/L (ref 135–145)

## 2017-09-05 LAB — BLOOD GAS, ARTERIAL
ACID-BASE DEFICIT: 3.7 mmol/L — AB (ref 0.0–2.0)
BICARBONATE: 20.8 mmol/L (ref 20.0–28.0)
DRAWN BY: 270211
FIO2: 40
LHR: 14 {breaths}/min
MECHVT: 510 mL
O2 Saturation: 91.2 %
PATIENT TEMPERATURE: 98.6
PCO2 ART: 37.9 mmHg (ref 32.0–48.0)
PEEP/CPAP: 5 cmH2O
PO2 ART: 68 mmHg — AB (ref 83.0–108.0)
pH, Arterial: 7.358 (ref 7.350–7.450)

## 2017-09-05 LAB — GLUCOSE, CAPILLARY
GLUCOSE-CAPILLARY: 196 mg/dL — AB (ref 65–99)
GLUCOSE-CAPILLARY: 216 mg/dL — AB (ref 65–99)
Glucose-Capillary: 136 mg/dL — ABNORMAL HIGH (ref 65–99)
Glucose-Capillary: 167 mg/dL — ABNORMAL HIGH (ref 65–99)
Glucose-Capillary: 184 mg/dL — ABNORMAL HIGH (ref 65–99)
Glucose-Capillary: 202 mg/dL — ABNORMAL HIGH (ref 65–99)
Glucose-Capillary: 239 mg/dL — ABNORMAL HIGH (ref 65–99)

## 2017-09-05 LAB — CBC WITH DIFFERENTIAL/PLATELET
BASOS ABS: 0 10*3/uL (ref 0.0–0.1)
BASOS PCT: 0 %
EOS ABS: 0 10*3/uL (ref 0.0–0.7)
Eosinophils Relative: 0 %
HCT: 36 % — ABNORMAL LOW (ref 39.0–52.0)
HEMOGLOBIN: 11.3 g/dL — AB (ref 13.0–17.0)
LYMPHS PCT: 4 %
Lymphs Abs: 1 10*3/uL (ref 0.7–4.0)
MCH: 28.2 pg (ref 26.0–34.0)
MCHC: 31.4 g/dL (ref 30.0–36.0)
MCV: 89.8 fL (ref 78.0–100.0)
MONO ABS: 1.5 10*3/uL — AB (ref 0.1–1.0)
Monocytes Relative: 6 %
NEUTROS PCT: 90 %
Neutro Abs: 23 10*3/uL — ABNORMAL HIGH (ref 1.7–7.7)
PLATELETS: 287 10*3/uL (ref 150–400)
RBC: 4.01 MIL/uL — ABNORMAL LOW (ref 4.22–5.81)
RDW: 15.9 % — ABNORMAL HIGH (ref 11.5–15.5)
WBC: 25.5 10*3/uL — AB (ref 4.0–10.5)

## 2017-09-05 LAB — MAGNESIUM
MAGNESIUM: 2.4 mg/dL (ref 1.7–2.4)
Magnesium: 2.4 mg/dL (ref 1.7–2.4)
Magnesium: 2.3 mg/dL (ref 1.7–2.4)

## 2017-09-05 LAB — PHOSPHORUS
PHOSPHORUS: 4.8 mg/dL — AB (ref 2.5–4.6)
PHOSPHORUS: 5.9 mg/dL — AB (ref 2.5–4.6)
Phosphorus: 5.3 mg/dL — ABNORMAL HIGH (ref 2.5–4.6)

## 2017-09-05 SURGERY — ENDOBRONCHIAL ULTRASOUND (EBUS)
Anesthesia: General | Laterality: Bilateral

## 2017-09-05 MED ORDER — MIDAZOLAM HCL 2 MG/2ML IJ SOLN
1.0000 mg | INTRAMUSCULAR | Status: DC | PRN
  Administered 2017-09-05 – 2017-09-08 (×9): 1 mg via INTRAVENOUS
  Filled 2017-09-05 (×7): qty 2

## 2017-09-05 MED ORDER — PRO-STAT SUGAR FREE PO LIQD
30.0000 mL | Freq: Two times a day (BID) | ORAL | Status: DC
  Administered 2017-09-05: 30 mL
  Filled 2017-09-05: qty 30

## 2017-09-05 MED ORDER — FENTANYL CITRATE (PF) 100 MCG/2ML IJ SOLN
50.0000 ug | INTRAMUSCULAR | Status: AC | PRN
  Administered 2017-09-05 – 2017-09-07 (×3): 50 ug via INTRAVENOUS
  Filled 2017-09-05 (×2): qty 2

## 2017-09-05 MED ORDER — LIDOCAINE HCL 2 % EX GEL: 1.0000 "application " | Freq: Once | CUTANEOUS | Status: DC

## 2017-09-05 MED ORDER — LACTATED RINGERS IV SOLN
INTRAVENOUS | Status: DC | PRN
  Administered 2017-09-05: 07:00:00 via INTRAVENOUS

## 2017-09-05 MED ORDER — FENTANYL CITRATE (PF) 100 MCG/2ML IJ SOLN
50.0000 ug | INTRAMUSCULAR | Status: DC | PRN
  Administered 2017-09-05 – 2017-09-08 (×14): 50 ug via INTRAVENOUS
  Filled 2017-09-05 (×15): qty 2

## 2017-09-05 MED ORDER — LIDOCAINE 2% (20 MG/ML) 5 ML SYRINGE
INTRAMUSCULAR | Status: DC | PRN
  Administered 2017-09-05: 60 mg via INTRAVENOUS

## 2017-09-05 MED ORDER — ORAL CARE MOUTH RINSE
15.0000 mL | Freq: Four times a day (QID) | OROMUCOSAL | Status: DC
  Administered 2017-09-05 – 2017-09-09 (×16): 15 mL via OROMUCOSAL

## 2017-09-05 MED ORDER — DEXMEDETOMIDINE HCL IN NACL 400 MCG/100ML IV SOLN
INTRAVENOUS | Status: DC | PRN
  Administered 2017-09-05: .5 ug/kg/h via INTRAVENOUS

## 2017-09-05 MED ORDER — ONDANSETRON HCL 4 MG/2ML IJ SOLN
INTRAMUSCULAR | Status: DC | PRN
  Administered 2017-09-05: 4 mg via INTRAVENOUS

## 2017-09-05 MED ORDER — BISACODYL 10 MG RE SUPP
10.0000 mg | Freq: Every day | RECTAL | Status: DC | PRN
Start: 2017-09-05 — End: 2017-09-10

## 2017-09-05 MED ORDER — DOCUSATE SODIUM 100 MG PO CAPS
100.0000 mg | ORAL_CAPSULE | Freq: Every day | ORAL | Status: DC | PRN
  Administered 2017-09-05: 100 mg via ORAL
  Filled 2017-09-05: qty 1

## 2017-09-05 MED ORDER — DOCUSATE SODIUM 50 MG/5ML PO LIQD: 100.0000 mg | Freq: Two times a day (BID) | ORAL | Status: DC | PRN

## 2017-09-05 MED ORDER — VITAL HIGH PROTEIN PO LIQD
1000.0000 mL | ORAL | Status: DC
  Filled 2017-09-05: qty 1000

## 2017-09-05 MED ORDER — INSULIN ASPART 100 UNIT/ML ~~LOC~~ SOLN
0.0000 [IU] | SUBCUTANEOUS | Status: DC
  Administered 2017-09-05 (×2): 5 [IU] via SUBCUTANEOUS
  Administered 2017-09-05: 3 [IU] via SUBCUTANEOUS
  Administered 2017-09-06: 5 [IU] via SUBCUTANEOUS
  Administered 2017-09-06 (×2): 8 [IU] via SUBCUTANEOUS
  Administered 2017-09-06 (×3): 5 [IU] via SUBCUTANEOUS
  Administered 2017-09-07 (×2): 3 [IU] via SUBCUTANEOUS
  Administered 2017-09-07: 5 [IU] via SUBCUTANEOUS
  Administered 2017-09-07: 2 [IU] via SUBCUTANEOUS
  Administered 2017-09-07 – 2017-09-08 (×4): 3 [IU] via SUBCUTANEOUS
  Administered 2017-09-08: 5 [IU] via SUBCUTANEOUS
  Administered 2017-09-08: 3 [IU] via SUBCUTANEOUS
  Administered 2017-09-08 (×2): 5 [IU] via SUBCUTANEOUS
  Administered 2017-09-09: 3 [IU] via SUBCUTANEOUS
  Administered 2017-09-09: 2 [IU] via SUBCUTANEOUS

## 2017-09-05 MED ORDER — PHENYLEPHRINE 40 MCG/ML (10ML) SYRINGE FOR IV PUSH (FOR BLOOD PRESSURE SUPPORT)
PREFILLED_SYRINGE | INTRAVENOUS | Status: DC | PRN
  Administered 2017-09-05 (×6): 80 ug via INTRAVENOUS

## 2017-09-05 MED ORDER — PROPOFOL 10 MG/ML IV BOLUS
INTRAVENOUS | Status: AC
  Filled 2017-09-05: qty 40

## 2017-09-05 MED ORDER — PHENYLEPHRINE HCL 0.25 % NA SOLN: 1.0000 | Freq: Four times a day (QID) | NASAL | Status: DC | PRN

## 2017-09-05 MED ORDER — ROCURONIUM BROMIDE 10 MG/ML (PF) SYRINGE
PREFILLED_SYRINGE | INTRAVENOUS | Status: DC | PRN
  Administered 2017-09-05: 50 mg via INTRAVENOUS
  Administered 2017-09-05: 30 mg via INTRAVENOUS

## 2017-09-05 MED ORDER — FAMOTIDINE IN NACL 20-0.9 MG/50ML-% IV SOLN
20.0000 mg | Freq: Two times a day (BID) | INTRAVENOUS | Status: DC
  Administered 2017-09-05 – 2017-09-09 (×9): 20 mg via INTRAVENOUS
  Filled 2017-09-05 (×9): qty 50

## 2017-09-05 MED ORDER — PROPOFOL 10 MG/ML IV BOLUS
INTRAVENOUS | Status: DC | PRN
  Administered 2017-09-05: 100 mg via INTRAVENOUS

## 2017-09-05 MED ORDER — CHLORHEXIDINE GLUCONATE 0.12% ORAL RINSE (MEDLINE KIT)
15.0000 mL | Freq: Two times a day (BID) | OROMUCOSAL | Status: DC
  Administered 2017-09-05 – 2017-09-09 (×8): 15 mL via OROMUCOSAL

## 2017-09-05 MED ORDER — MIDAZOLAM HCL 2 MG/2ML IJ SOLN
1.0000 mg | INTRAMUSCULAR | Status: DC | PRN
  Administered 2017-09-07: 1 mg via INTRAVENOUS
  Filled 2017-09-05 (×9): qty 2

## 2017-09-05 MED ORDER — DEXMEDETOMIDINE HCL IN NACL 200 MCG/50ML IV SOLN
0.0000 ug/kg/h | INTRAVENOUS | Status: DC
  Administered 2017-09-05: 0.7 ug/kg/h via INTRAVENOUS
  Administered 2017-09-05 (×2): 0.6 ug/kg/h via INTRAVENOUS
  Administered 2017-09-05: 0.4 ug/kg/h via INTRAVENOUS
  Administered 2017-09-06: 0.8 ug/kg/h via INTRAVENOUS
  Administered 2017-09-06 (×2): 0.7 ug/kg/h via INTRAVENOUS
  Administered 2017-09-06: 1.2 ug/kg/h via INTRAVENOUS
  Administered 2017-09-06: 0.7 ug/kg/h via INTRAVENOUS
  Administered 2017-09-06: 1.2 ug/kg/h via INTRAVENOUS
  Filled 2017-09-05 (×3): qty 50
  Filled 2017-09-05: qty 100
  Filled 2017-09-05 (×2): qty 50
  Filled 2017-09-05: qty 100
  Filled 2017-09-05 (×3): qty 50

## 2017-09-05 MED ORDER — FENTANYL CITRATE (PF) 100 MCG/2ML IJ SOLN
INTRAMUSCULAR | Status: AC
  Filled 2017-09-05: qty 2

## 2017-09-05 MED ORDER — VITAL AF 1.2 CAL PO LIQD
1000.0000 mL | ORAL | Status: DC
  Administered 2017-09-05 – 2017-09-07 (×4): 1000 mL
  Filled 2017-09-05 (×7): qty 1000

## 2017-09-05 MED ORDER — PRO-STAT SUGAR FREE PO LIQD
30.0000 mL | Freq: Every day | ORAL | Status: DC
  Administered 2017-09-06 – 2017-09-08 (×3): 30 mL
  Filled 2017-09-05 (×3): qty 30

## 2017-09-05 NOTE — Anesthesia Procedure Notes (Signed)
Procedure Name: Intubation Date/Time: 09/14/2017 7:38 AM Performed by: Claudia Desanctis, CRNA Pre-anesthesia Checklist: Patient identified, Emergency Drugs available, Suction available and Patient being monitored Patient Re-evaluated:Patient Re-evaluated prior to induction Oxygen Delivery Method: Circle system utilized Preoxygenation: Pre-oxygenation with 100% oxygen Induction Type: IV induction Ventilation: Mask ventilation without difficulty and Oral airway inserted - appropriate to patient size Laryngoscope Size: 2 and Miller Grade View: Grade I Tube type: Oral Tube size: 9.0 mm Number of attempts: 1 Airway Equipment and Method: Stylet Placement Confirmation: ETT inserted through vocal cords under direct vision,  positive ETCO2 and breath sounds checked- equal and bilateral Secured at: 23 cm Tube secured with: Tape Dental Injury: Teeth and Oropharynx as per pre-operative assessment

## 2017-09-05 NOTE — Progress Notes (Signed)
[place] Progress Note Patient Name: Henry [name] DOB: February 04, 1935 MRN: [id number]   Date of Service  09/11/2017  HPI/Events of Note  Request to change patient status to ICU bed.   eICU Interventions  Will change status to ICU bed.      Intervention Category Major Interventions: Other:  Bradyn Vassey Cornelia Copa 09/04/2017, 12:08 AM

## 2017-09-05 NOTE — Progress Notes (Signed)
RT NT suctioned and gave patient CPT ( via bed for 20 min)at 0000 treatment time to help with secretions. No results with either. Patient tolerated well with both.

## 2017-09-05 NOTE — H&P (Signed)
[facility]  HPI:  83 y/o male with a large lung mass intubated for dyspnea, has been in the ICU.  He is here today for an EBUS, bronchoscopy exam and biopsy.  He self extubated over the weekend and has dyspnea.  Past Medical History:  Diagnosis Date  . Anemia    PMH of  . Anisocoria    congenital  . Diabetes mellitus   . GERD (gastroesophageal reflux disease)   . Hyperlipidemia    mixed  . Other abnormal glucose   . Vitamin D deficiency      Family History  Problem Relation Age of Onset  . Alcohol abuse Mother   . Stroke Mother 48  . COPD Father   . Heart disease Brother 75       stents  . Diabetes Neg Hx   . Cancer Neg Hx      Social History   Socioeconomic History  . Marital status: Married    Spouse name: Not on file  . Number of children: Not on file  . Years of education: Not on file  . Highest education level: Not on file  Social Needs  . Financial resource strain: Not on file  . Food insecurity - worry: Not on file  . Food insecurity - inability: Not on file  . Transportation needs - medical: Not on file  . Transportation needs - non-medical: Not on file  Occupational History  . Not on file  Tobacco Use  . Smoking status: Never Smoker  . Smokeless tobacco: Never Used  Substance and Sexual Activity  . Alcohol use: Yes    Comment:  Rarely  . Drug use: No  . Sexual activity: Not on file  Other Topics Concern  . Not on file  Social History Narrative  . Not on file     Allergies  Allergen Reactions  . Sulfa Drugs Cross Reactors     rash     @[email or web address] @ Vitals:   09/13/2017 0400 09/17/2017 0500 09/01/2017 0511 09/26/2017 0600  BP: (!) 163/70 (!) 171/79  (!) 176/70  Pulse: (!) 111 (!) 109  (!) 112  Resp: 17 15  17   Temp: 97.9 F (36.6 C)     TempSrc: Oral     SpO2: 97% 96% 96% 95%  Weight:      Height:       General:  Resting  in bed, mild dyspnea HENT: NCAT OP clear PULM: Upper airway rhonchi, normal effort CV: RRR, no mgr GI: BS+, soft,  nontender MSK: normal bulk and tone Neuro: awake, alert, no distress, MAEW  CBC    Component Value Date/Time   WBC 25.5 (H) 09/25/2017 0441   RBC 4.01 (L) 09/16/2017 0441   HGB 11.3 (L) 09/22/2017 0441   HCT 36.0 (L) 09/21/2017 0441   PLT 287 09/18/2017 0441   MCV 89.8 09/26/2017 0441   MCH 28.2 09/06/2017 0441   MCHC 31.4 09/19/2017 0441   RDW 15.9 (H) 09/16/2017 0441   LYMPHSABS 1.0 09/21/2017 0441   MONOABS 1.5 (H) 09/26/2017 0441   EOSABS 0.0 08/31/2017 0441   BASOSABS 0.0 09/27/2017 0441   BMET    Component Value Date/Time   NA 145 09/27/2017 0441   K 4.2 09/19/2017 0441   CL 115 (H) 09/29/2017 0441   CO2 21 (L) 09/25/2017 0441   GLUCOSE 176 (H) 09/04/2017 0441   BUN 45 (H) 09/29/2017 0441   CREATININE 1.10 09/23/2017 0441   CALCIUM 9.3 09/22/2017 0441   GFRNONAA >  60 08/31/2017 0441   GFRAA >60 09/18/2017 0441   Impression/plan:  Large lung mass: plan intubation, general anesthesia bronchoscopy and endobronchial ultrasound for biopsy today Acute respiratory failure with hypoxemia: will leave intubated post procedure, transfer to ICU on vent; will write vent, sedation  Roselie Awkward, MD [place] [facility] Pager: [phone] Cell: [phone] After 3pm or if no response, call [phone]

## 2017-09-05 NOTE — Progress Notes (Signed)
Video bronchoscopy performed with EBUS  Intervention bronchial brushings Intervention bronchial biopsies Pt tolerated well  Kathie Dike RRT

## 2017-09-05 NOTE — Anesthesia Procedure Notes (Signed)
Procedure Name: Intubation Date/Time: 09/25/2017 8:49 AM Performed by: Claudia Desanctis, CRNA Pre-anesthesia Checklist: Patient identified, Emergency Drugs available, Suction available and Patient being monitored Patient Re-evaluated:Patient Re-evaluated prior to induction Oxygen Delivery Method: Circle system utilized Preoxygenation: Pre-oxygenation with 100% oxygen Induction Type: IV induction Laryngoscope Size: 2 and Miller Grade View: Grade I Tube type: Subglottic suction tube Tube size: 8.0 mm Number of attempts: 1 Airway Equipment and Method: Stylet Placement Confirmation: ETT inserted through vocal cords under direct vision,  positive ETCO2 and breath sounds checked- equal and bilateral Secured at: 24 cm Tube secured with: Tape Dental Injury: Teeth and Oropharynx as per pre-operative assessment

## 2017-09-05 NOTE — Progress Notes (Signed)
PULMONARY / CRITICAL CARE MEDICINE   Name: Henry [name] MRN: [id number] DOB: 18-Nov-1934    ADMISSION DATE:  09/19/2017 CONSULTATION DATE:  08/31/2017  REFERRING MD:  Dyann Kief  CHIEF COMPLAINT:  dyspnea  HISTORY OF PRESENT ILLNESS:   83 y/o male with a recently diagnosed lung mass presented to the hospital with dyspnea.  He was intubated, self extubated, remained short of breath.   PAST MEDICAL HISTORY :  He  has a past medical history of Anemia, Anisocoria, Diabetes mellitus, GERD (gastroesophageal reflux disease), Hyperlipidemia, Other abnormal glucose, and Vitamin D deficiency.   REVIEW OF SYSTEMS:   As above  SUBJECTIVE:  As above  VITAL SIGNS: BP (!) 163/47   Pulse (!) 111   Temp 98.6 F (37 C) (Oral)   Resp 18   Ht 5\' 6"  (1.676 m)   Wt 83 kg (183 lb)   SpO2 91%   BMI 29.54 kg/m   HEMODYNAMICS:    VENTILATOR SETTINGS: Vent Mode: PSV;BIPAP FiO2 (%):  [3 %-100 %] 40 % PEEP:  [5 cmH20] 5 cmH20 Pressure Support:  [5 cmH20] 5 cmH20  INTAKE / OUTPUT: I/O last 3 completed shifts: In: 2843.7 [I.V.:2047.5; Other:360; NG/GT:286.2; IV Piggyback:150] Out: 1191 [Urine:4725]  PHYSICAL EXAMINATION:  General:  Resting in bed, increased work of breathing HENT: NCAT OP clear PULM: upper airway rhonchi bilaterally, normal effort CV: RRR, no mgr GI: BS+, soft, nontender MSK: normal bulk and tone Neuro: awake, alert, no distress, MAEW   LABS:  BMET Recent Labs  Lab 09/03/17 1642 09/03/17 2342 08/31/2017 0441  NA 141 140 145  K 3.9 4.0 4.2  CL 110 109 115*  CO2 23 23 21*  BUN 24* 29* 45*  CREATININE 0.99 0.97 1.10  GLUCOSE 165* 193* 176*    Electrolytes Recent Labs  Lab 09/03/17 0448 09/03/17 1642 09/03/17 2342 09/16/2017 0441  CALCIUM 9.3 9.1 9.3 9.3  MG 2.1 2.2  --  2.4  PHOS 3.8 4.1  --  4.8*    CBC Recent Labs  Lab 09/03/17 0448 09/04/17 0402 09/24/2017 0441  WBC 23.0* 35.5* 25.5*  HGB 10.7* 11.8* 11.3*  HCT 34.0* 37.0* 36.0*  PLT 254 312  287    Coag's No results for input(s): APTT, INR in the last 168 hours.  Sepsis Markers Recent Labs  Lab 09/08/2017 1525 08/31/17 1349 09/01/17 0319 09/02/17 0332  LATICACIDVEN 2.98* 2.1* 2.6*  --   PROCALCITON  --  0.19 0.12 0.11    ABG Recent Labs  Lab 08/31/17 1425 08/31/17 1905 09/02/17 1351  PHART 7.369 7.443 7.272*  PCO2ART 44.8 38.8 54.7*  PO2ART 60.2* 131* 112*    Liver Enzymes Recent Labs  Lab 08/31/17 0417  AST 21  ALT 17  ALKPHOS 151*  BILITOT 0.9  ALBUMIN 3.3*    Cardiac Enzymes Recent Labs  Lab 09/04/17 0402 09/04/17 0930 09/04/17 1524  TROPONINI 0.04* 0.03* <0.03    Glucose Recent Labs  Lab 09/04/17 0707 09/04/17 1146 09/04/17 1517 09/04/17 2014 09/04/17 2321 09/01/2017 0352  GLUCAP 80 111* 130* 148* 144* 167*    Imaging No results found.  SIGNIFICANT EVENTS: 09/16/2017 - admit  Been to [facility] on 09/29/17 for shortness of breath. CT of the chest right lower lobe mass and small pleural effusion. Airway compression 08/31/17 - intubated 09/02/17 self extubated and reintubated 1/6 self extubated 1/7 EBUS   CULTURE: 1/1 Blood>  1/2 RSV >   ANTIBIOTICS: 1/1 Cefepime>  1/1 Vanc x1 1/1 Augmentin> 1/2  LINES/TUBES: 1/2  ETT> 1/4 (self ext, reint), 1/4 > 1/6 (self ext), 1/7 > 1/3 PICC >   DISCUSSION: 83 y/o male with a recently diagnosed lung mass causing airway compression and acute respiratory failure with hypoxemia.  He is a life long non-smoker.   ASSESSMENT / PLAN:  PULMONARY A: Acute respiratory failure with hypoxemia Large Right Lower Lung mass with bulky mediastinal lymphadenopathy P:   Intubate for bronchoscopy, leave intubated Full mechanical vent support VAP prevention Daily WUA/SBT EBUS today Continue solumedrol  CARDIOVASCULAR A:  Sinus tachycardia P:  Tele Monitor hemodynamics  RENAL A:   No acute issues P:   Monitor BMET and UOP Replace electrolytes as needed   GASTROINTESTINAL A:    GERD P:   Continue PPI May need to restart tube feeding depending on how he does from a respiratory standpoint  HEMATOLOGIC A:   Mild anemia, normocytic P:  Monitor for bleeding  INFECTIOUS A:   ? Post obstructive pneumonia P:   Consider stopping cefepime 1/8  ENDOCRINE A:   Hyperglycemia on steroids  P:   Start SSI q4 D/C ICU hyperglycemia protocol  NEUROLOGIC A:   No acute issues P:   RASS goal: -1 to -2 PAD protocol, Precedex, fentanyl prn, versed prn  FAMILY  - Updates: None bedside 1/7, will update after bronchoscopy  - Inter-disciplinary family meet or Palliative Care meeting due by:  day 7  My cc time 35 minutes  Roselie Awkward, MD [place] PCCM Pager: [phone] Cell: [phone] After 3pm or if no response, call [phone]   09/29/2017, 7:21 AM

## 2017-09-05 NOTE — Transfer of Care (Signed)
Immediate Anesthesia Transfer of Care Note  Patient: Henry [name]  Procedure(s) Performed: ENDOBRONCHIAL ULTRASOUND (Bilateral )  Patient Location: icu  Anesthesia Type:General  Level of Consciousness: sedated and Patient remains intubated per anesthesia plan  Airway & Oxygen Therapy: Patient remains intubated per anesthesia plan and Patient placed on Ventilator (see vital sign flow sheet for setting)  Post-op Assessment: Report given to RN and Post -op Vital signs reviewed and stable  Post vital signs: Reviewed and stable  Last Vitals:  Vitals:   09/26/2017 0600 09/07/2017 0716  BP: (!) 176/70 (!) 163/47  Pulse: (!) 112 (!) 111  Resp: 17 18  Temp:  37 C  SpO2: 95% 91%    Last Pain:  Vitals:   09/02/2017 0716  TempSrc: Oral  PainSc:       Patients Stated Pain Goal: 4 (83/37/44 5146)  Complications: No apparent anesthesia complications

## 2017-09-05 NOTE — Anesthesia Postprocedure Evaluation (Signed)
Anesthesia Post Note  Patient: Tallis Soledad  Procedure(s) Performed: ENDOBRONCHIAL ULTRASOUND (Bilateral )     Patient location during evaluation: ICU Anesthesia Type: General Level of consciousness: patient remains intubated per anesthesia plan Pain management: pain level controlled Vital Signs Assessment: post-procedure vital signs reviewed and stable Respiratory status: respiratory function stable and patient remains intubated per anesthesia plan Cardiovascular status: blood pressure returned to baseline and stable Postop Assessment: no apparent nausea or vomiting Anesthetic complications: no    Last Vitals:  Vitals:   08/31/2017 1151 09/20/2017 1152  BP:    Pulse:    Resp:    Temp:    SpO2: 92% 93%    Last Pain:  Vitals:   09/20/2017 1125  TempSrc: Axillary  PainSc:                  Riccardo Dubin

## 2017-09-05 NOTE — Progress Notes (Signed)
Nutrition Follow-up  DOCUMENTATION CODES:   Not applicable  INTERVENTION:  - Will order Vital AF 1.2 @ 55 mL/hr with 30 mL Prostat once/day. This regimen will provide 1684 kcal, 114 grams of protein, and 1070 mL free water.    NUTRITION DIAGNOSIS:   Increased nutrient needs related to cancer and cancer related treatments, catabolic illness as evidenced by estimated needs. -ongoing  GOAL:   Patient will meet greater than or equal to 90% of their needs -unmet at this time  MONITOR:   Vent status, TF tolerance, Weight trends, Labs  REASON FOR ASSESSMENT:   Ventilator, Consult Enteral/tube feeding initiation and management  ASSESSMENT:   Pt with PMH of DM, GERD, HLD, HTN, and newly diagnosed lung cancer presents with chest pain and SOB found acute respiratory failure with hypoxia    Events: 1/1: admitted for SOB. CT scan revealed lung mass and pleural effusion. 1/2: placed on BiPAP without any improvement, pt intubated to protect airway, bronchoscopy shows massive subcarinal mass. 1/4: self extubated, then was reintubated. TF was resumed: Vital HP @ 38ml/hr. 1/5: self extubated ~2214 per nursing notes 1/7: re-intubated this AM and bronch done   1/7 Consult to resume TF now that pt re-intubated. NGT in place to L nare and abd x-ray reading stated tip of tube is in the stomach, with side-port beyond the GE and below the diaphragm. Estimated nutrition needs updated. Will order TF as outlined above. Weight trending down over the past few days; will continue to monitor trend and make adjustments as warranted.   Patient is currently intubated on ventilator support MV: 8 L/min Temp (24hrs), Avg:98.2 F (36.8 C), Min:97.9 F (36.6 C), Max:98.6 F (37 C) Propofol: none BP: 110/52 and MAP: 70   Medications reviewed; 20 mg IV Pepcid BID, sliding scale Novolog, 60 mg Solu-medrol BID, 40 mg Protonix/day.  Labs reviewed; CBGs: 167 and 184 mg/dL this AM, Cl: 115 mmol/L, BUN: 45  mg/dL, Phos: 4.8 mg/dL.  IVF: D5-1/2 NS @ 10 mL/hr (41 kcal). Drip: Precedex @ 0.6 mcg/kg/hr.    1/6 - Pt currently NPO and not intubated.  - Recommendations for TF if pt is to be re-intubated: resume Vital HP @ 50 ml/hr. - Provides 1200kcals,105grams protein, 1065ml free water. - Recommend resuming Ensure supplements if diet is advanced    1/4 - Attempted extubation today, failed high flow.  - Tube feedings discontinued.  - Pt re-intubated.  - RD consulted for tube feeding recommendation.  - Adjusted needs according to vent setting.  - Pt remains on propofol.  - Plan to keep pt sedated until bronchoscopy. Will re order TF.   Patient is currently intubated on ventilator support MV: 10.9 L/min Temp (24hrs), Avg:98.9 F (37.2 C), Min:97.8 F (36.6 C), Max:99.8 F (37.7 C) BP 159/78 MAP: 106 Propofol: 15.7 ml/hr- provides 414 kcal    Diet Order:  Diet NPO time specified  EDUCATION NEEDS:   No education needs have been identified at this time  Skin:  Skin Assessment: Reviewed RN Assessment  Last BM:  12/31  Height:   Ht Readings from Last 1 Encounters:  09/12/2017 5\' 6"  (1.676 m)    Weight:   Wt Readings from Last 1 Encounters:  09/23/2017 183 lb (83 kg)    Ideal Body Weight:  64.5 kg  BMI:  Body mass index is 29.54 kg/m.  Estimated Nutritional Needs:   Kcal:  1643  Protein:  100-124 grams (1.2-1.5 grams/kg)  Fluid:  1.8L/day     Janett Billow  Ceasar Mons, MS, RD, LDN, CNSC Inpatient Clinical Dietitian Pager # [phone] After hours/weekend pager # [phone]

## 2017-09-05 NOTE — Progress Notes (Signed)
Oncology note  I went to floor to see pt, he was off floor, no family member available.   I have reviewed his chart, agree with urgent radiation.  I will stop by again tomorrow and do a formal consult. Hope we have the path result available tomorrow.   Truitt Merle  09/28/2017

## 2017-09-05 NOTE — Progress Notes (Signed)
[facility] Progress Note Patient Name: Henry [name] DOB: 07-Sep-1934 MRN: [id number]   Date of Service  09/26/2017  HPI/Events of Note  Constipation   eICU Interventions  Will order Colace PO and Dulcolax Suppository PRN.     Intervention Category Intermediate Interventions: Other:  Lysle Dingwall 09/04/2017, 4:52 AM

## 2017-09-05 NOTE — Brief Op Note (Signed)
[facility]  Endobronchial ultrasound, bronchial brushing's, bronchial biopsies and transbronchial needle aspiration of lymphadenopathy performed.  See procedure note.  Roselie Awkward, MD [place] P[facility]M Pager: [phone] Cell: [phone] After 3pm or if no response, call [phone]

## 2017-09-05 NOTE — Op Note (Signed)
[facility] Cardiopulmonary Patient Name: Henry [name] Procedure Date: 09/16/2017 MRN: [id number] Attending MD: Juanito Doom , MD Date of Birth: [birth year] CSN: [id number] Age: 83 Admit Type: Inpatient Ethnicity: Not Hispanic or Latino Procedure:            Bronchoscopy Indications:          Sub-carina mass, Right lower lobe mass Providers:            Nathaneil Canary B. Lake Bells, MD, Cleda Daub, RN, Tinnie Gens,                        Technician, Ashley Mariner RRT,RCP Referring MD:          Medicines:            General Anesthesia Complications:        No immediate complications Estimated Blood Loss: Estimated blood loss: none. Procedure:      Pre-Anesthesia Assessment:      - A History and Physical has been performed. Patient meds and allergies       have been reviewed. The risks and benefits of the procedure and the       sedation options and risks were discussed with the patient. All       questions were answered and informed consent was obtained. Patient       identification and proposed procedure were verified prior to the       procedure by the physician and the nurse in the pre-procedure area.       Mental Status Examination: normal. Airway Examination: normal       oropharyngeal airway. Respiratory Examination: rhonchi. CV Examination:       normal. ASA Grade Assessment: II - A patient with mild systemic disease.       After reviewing the risks and benefits, the patient was deemed in       satisfactory condition to undergo the procedure. The anesthesia plan was       to use general anesthesia. Immediately prior to administration of       medications, the patient was re-assessed for adequacy to receive       sedatives. The heart rate, respiratory rate, oxygen saturations, blood       pressure, adequacy of pulmonary ventilation, and response to care were       monitored throughout the procedure. The physical status of the patient       was re-assessed after  the procedure.      After obtaining informed consent, the bronchoscope was passed under       direct vision. Throughout the procedure, the patient's blood pressure,       pulse, and oxygen saturations were monitored continuously. the [id number]       [id number] scope was introduced through the mouth, via the endotracheal       tube and advanced to the tracheobronchial tree of both lungs. the       [id number] ([id number]) scope was introduced through the and advanced to       the. The procedure was accomplished without difficulty. The patient       tolerated the procedure well. The total duration of the procedure was 45       minutes. Findings:      The proximal trachea mucosa was normal in appearance. The carina and       entire Right mainstem bronchus was infiltrated with an endobronchial  mass. This extended into the right tracheobronchial tree causing severe       narrowing. I could pass the scope into the bronchus intermedius but it       was difficult to discern anatomy beyond that due to the severe narrowing       from diffuse airway infiltration from the mass. The left mainstem was       normal but there was a mass obstructing the lingula.      Left Lung Abnormalities: A completely obstructing mass was found       proximally, at the orifice of the lingula in the left upper lobe. The       mass was medium-sized and endobronchial. The lesion was not traversed.      Right Lung Abnormalities: A partially obstructing mass was found       proximally, at the orifice throughout the right tracheobronchial tree.       The mass was large and endobronchial, infiltrative and submucosal. The       lesion was successfully traversed. Endobronchial biopsies were performed       in the right upper lobe using forceps and sent for histopathology       examination. Five samples were obtained. Endobronchial biopsies were       performed in the left upper lobe using forceps and sent for       histopathology  examination. Five samples were obtained. Brushings were       obtained in the right mainstem bronchus and in the right upper lobe and       sent for routine cytology. Four samples were obtained. Brushings were       obtained in the right upper lobe and sent for routine cytology. Five       samples were obtained. An endobronchial ultrasound endoscope was       utilized in order to assist with fine needle aspiration in the right       paratracheal area and in the subcarinal area. Transbronchial needle       aspiration of a Lymphnode was performed in the right paratracheal area       and in the subcarinal area using an Olympus EBUS-TBNA needle and sent       for routine cytology. The procedure was guided by ultrasound. 5 from       Subcarina (station 7) and 4 from right pretracheal (4R) were obtained.       Stage N2. Impression:      - Sub-carina mass      - Right lower lobe mass      - An endobronchial mass was found in the left upper lobe. This lesion is       likely malignant.      - An endobronchial, infiltrative and submucosal mass was found       throughout the tracheobronchial tree. This lesion is malignant.      - An endobronchial biopsy was performed.      - An endobronchial biopsy was performed.      - Brushings were obtained.      - Brushings were obtained.      - Endobronchial ultrasound was performed.      - A transbronchial needle aspiration was performed. Moderate Sedation:      General Anesthesia administered by the anesthesia team. Recommendation:      - Await test results. Procedure Code(s):      --- Professional ---  31629, Bronchoscopy, rigid or flexible, including fluoroscopic guidance,       when performed; with transbronchial needle aspiration biopsy(s),       trachea, main stem and/or lobar bronchus(i)      31625, 59, Bronchoscopy, rigid or flexible, including fluoroscopic       guidance, when performed; with bronchial or endobronchial biopsy(s),        single or multiple sites      96295, Bronchoscopy, rigid or flexible, including fluoroscopic guidance,       when performed; with brushing or protected brushings      31654, Bronchoscopy, rigid or flexible, including fluoroscopic guidance,       when performed; with transendoscopic endobronchial ultrasound (EBUS)       during bronchoscopic diagnostic or therapeutic intervention(s) for       peripheral lesion(s) (List separately in addition to code for primary       procedure[s]) Diagnosis Code(s):      --- Professional ---      R91.8, Other nonspecific abnormal finding of lung field      J98.9, Respiratory disorder, unspecified      C39.9, Malignant neoplasm of lower respiratory tract, part unspecified CPT copyright 2016 [employer]. All rights reserved. The codes documented in this report are preliminary and upon coder review may  be revised to meet current compliance requirements. Norlene Campbell, MD Juanito Doom, MD 09/29/2017 9:02:33 AM This report has been signed electronically. Number of Addenda: 0 Scope In: Scope Out:

## 2017-09-05 NOTE — Consult Note (Addendum)
Radiation Oncology         [phone] ________________________________  Initial inpatient Consultation  Name: Henry [name] MRN: [id number]  Date of Service: 09/03/2017        DOB: [birth year]  NW:GNFAOZHY, Real Cons, MD  No ref. provider found   REFERRING PHYSICIAN: No ref. provider found  DIAGNOSIS: The primary encounter diagnosis was Hypoxia. Diagnoses of Pleural effusion, Right lower lobe lung mass, Respiratory distress, Sternal mass, Endotracheal tube present, Lytic lesion of bone on x-ray, Encounter for orogastric (OG) tube placement, Pneumonia, Endotracheally intubated, Acute respiratory failure with hypoxia ([place]), Acute respiratory failure with hypoxemia ([place]), and Encounter for nasogastric (NG) tube placement were also pertinent to this visit.    ICD-10-CM   1. Hypoxia R09.02   2. Pleural effusion J90 [place] CHEST (PLEURAL EFFUSION)    [place] CHEST (PLEURAL EFFUSION)    CANCELED: US THORACENTESIS ASP PLEURAL SPACE W/IMG GUIDE    CANCELED: US THORACENTESIS ASP PLEURAL SPACE W/IMG GUIDE  3. Right lower lobe lung mass R91.8 CANCELED: IR Radiologist Eval & Mgmt    CANCELED: IR Radiologist Eval & Mgmt    CANCELED: IR Radiologist Eval & Mgmt    CANCELED: IR Radiologist Eval & Mgmt    CANCELED: IR Radiologist Eval & Mgmt    CANCELED: CT BIOPSY    CANCELED: IR Radiologist Eval & Mgmt    CANCELED: CT BIOPSY  4. Respiratory distress R06.03 DG CHEST PORT 1 VIEW    DG CHEST PORT 1 VIEW  5. Sternal mass M89.9 CANCELED: CT BIOPSY    CANCELED: CT BIOPSY  6. Endotracheal tube present Z97.8 DG CHEST PORT 1 VIEW    DG CHEST PORT 1 VIEW    DG CHEST PORT 1 VIEW  7. Lytic lesion of bone on x-ray M89.9 CANCELED: IR Radiologist Eval & Mgmt    CANCELED: IR Radiologist Eval & Mgmt  8. Encounter for orogastric (OG) tube placement Z46.59 DG Abd 1 View    DG Abd 1 View  9. Pneumonia J18.9 DG Chest [facility] 1 View    DG Chest Port 1 View  10. Endotracheally intubated Z97.8 Portable Chest xray   Portable Chest xray  11. Acute respiratory failure with hypoxia ([facility]) J96.01   12. Acute respiratory failure with hypoxemia ([place]) J96.01 DG Chest [facility] 1 View    DG Chest Port 1 View  13. Encounter for nasogastric (NG) tube placement Z46.59 DG Abd 1 View    DG Abd 1 View    HISTORY OF PRESENT ILLNESS: Henry [name] is a 83 y.o. male seen at the request of Dr. Lake Bells for a probable locally advanced lung cancer. The patient had been in his usual state of health in November and made a trip to [place] prior to Thanksgiving. He noted steady decline and increasing shortness of breath. He had been undergoing a workup in the outpatient setting when he had a CT scan that revealed a large right lower lobe mass and right effusion. He was set up to undergo CT guided biopsy and PET, but presented to the ED on 09/20/2017 complaining of acute shortness of breath. A CTPA revealed a large 5.5 x 6.7 cm subcarinal mass extending into the precarinal and right hilar regions and is contiguous with a 3.3 x 2.7 cm right peritracheal mass, and a 2 cm prevascular node, a 1.6 cm high right paratracheal node, and a 4.9 x 3.6 cm mass in the superior segment of the right lower lobe.He has a destructive manubrium lesion as well as  right pleural effusion. He became unstable from a respiratory standpoint on 08/31/17 and was intubated. He's self extubated and was re intubated over the weekend. He underwent bronchoscopy today with Dr. Dr. Lake Bells, and given the distribution of his tumor and disease burden, we are asked to consider palliative radiotherapy.    PREVIOUS RADIATION THERAPY: No  PAST MEDICAL HISTORY:  Past Medical History:  Diagnosis Date  . Anemia    PMH of  . Anisocoria    congenital  . Diabetes mellitus   . GERD (gastroesophageal reflux disease)   . Hyperlipidemia    mixed  . Other abnormal glucose   . Vitamin D deficiency       PAST SURGICAL HISTORY: Past Surgical History:  Procedure Laterality Date  . bladder  resection for cancer  1998   Dr Hartley Barefoot  . CATARACT EXTRACTION    . COLONOSCOPY  2006 & 2012   Dr Lajoyce Corners  . PENILE PROSTHESIS IMPLANT  2011   Dr Hartley Barefoot    FAMILY HISTORY:  Family History  Problem Relation Age of Onset  . Alcohol abuse Mother   . Stroke Mother 74  . COPD Father   . Heart disease Brother 75       stents  . Diabetes Neg Hx   . Cancer Neg Hx     SOCIAL HISTORY:  Social History   Socioeconomic History  . Marital status: Married    Spouse name: Not on file  . Number of children: Not on file  . Years of education: Not on file  . Highest education level: Not on file  Social Needs  . Financial resource strain: Not on file  . Food insecurity - worry: Not on file  . Food insecurity - inability: Not on file  . Transportation needs - medical: Not on file  . Transportation needs - non-medical: Not on file  Occupational History  . Not on file  Tobacco Use  . Smoking status: Never Smoker  . Smokeless tobacco: Never Used  Substance and Sexual Activity  . Alcohol use: Yes    Comment:  Rarely  . Drug use: No  . Sexual activity: Not on file  Other Topics Concern  . Not on file  Social History Narrative  . Not on file    ALLERGIES: Sulfa drugs cross reactors  MEDICATIONS:  Current Facility-Administered Medications  Medication Dose Route Frequency Provider Last Rate Last Dose  . bisacodyl (DULCOLAX) suppository 10 mg  10 mg Rectal Daily PRN Anders Simmonds, MD      . ceFEPIme (MAXIPIME) 1 g in dextrose 5 % 50 mL IVPB  1 g Intravenous Q8H Emiliano Dyer, RPH 100 mL/hr at 09/28/2017 1134 1 g at 09/02/2017 1134  . chlorhexidine gluconate (MEDLINE KIT) (PERIDEX) 0.12 % solution 15 mL  15 mL Mouth Rinse BID Simonne Maffucci B, MD      . Chlorhexidine Gluconate Cloth 2 % PADS 6 each  6 each Topical Daily Brand Males, MD   6 each at 09/04/17 2227  . dexmedetomidine (PRECEDEX) 200 MCG/50ML (4 mcg/mL) infusion  0-1.2 mcg/kg/hr Intravenous Continuous Juanito Doom, MD   Stopped at 09/21/2017 1100  . dextrose 5 %-0.45 % sodium chloride infusion   Intravenous Continuous Brand Males, MD 10 mL/hr at 09/29/2017 0700    . docusate (COLACE) 50 MG/5ML liquid 100 mg  100 mg Per Tube BID PRN Simonne Maffucci B, MD      . famotidine (PEPCID) IVPB 20 mg premix  20  mg Intravenous Q12H Simonne Maffucci B, MD 100 mL/hr at 09/29/2017 1134 20 mg at 09/21/2017 1134  . [START ON 09/06/2017] feeding supplement (PRO-STAT SUGAR FREE 64) liquid 30 mL  30 mL Per Tube Daily Simonne Maffucci B, MD      . feeding supplement (VITAL AF 1.2 CAL) liquid 1,000 mL  1,000 mL Per Tube Continuous Juanito Doom, MD 55 mL/hr at 09/20/2017 1223 1,000 mL at 09/21/2017 1223  . fentaNYL (SUBLIMAZE) injection 50 mcg  50 mcg Intravenous Q15 min PRN Juanito Doom, MD   50 mcg at 09/20/2017 0736  . fentaNYL (SUBLIMAZE) injection 50 mcg  50 mcg Intravenous Q2H PRN Simonne Maffucci B, MD      . heparin injection 5,000 Units  5,000 Units Subcutaneous Q8H Brand Males, MD   Stopped at 09/01/2017 [phone]  . hydrALAZINE (APRESOLINE) injection 10-20 mg  10-20 mg Intravenous Q6H PRN Collene Gobble, MD      . insulin aspart (novoLOG) injection 0-15 Units  0-15 Units Subcutaneous Q4H Juanito Doom, MD   3 Units at 09/10/2017 1133  . ipratropium-albuterol (DUONEB) 0.5-2.5 (3) MG/3ML nebulizer solution 3 mL  3 mL Nebulization Q4H Brand Males, MD   3 mL at 09/26/2017 1145  . labetalol (NORMODYNE,TRANDATE) injection 20 mg  20 mg Intravenous Q6H PRN Brand Males, MD      . MEDLINE mouth rinse  15 mL Mouth Rinse QID Simonne Maffucci B, MD      . methylPREDNISolone sodium succinate (SOLU-MEDROL) 125 mg/2 mL injection 60 mg  60 mg Intravenous Q12H Brand Males, MD   60 mg at 09/26/2017 0148  . midazolam (VERSED) injection 1 mg  1 mg Intravenous Q15 min PRN Simonne Maffucci B, MD      . midazolam (VERSED) injection 1 mg  1 mg Intravenous Q2H PRN Simonne Maffucci B, MD      . MUSCLE RUB CREA 1  application  1 application Topical PRN Barton Dubois, MD   1 application at 27/03/50 0647  . nicardipine (CARDENE) 30m in 0.86% saline 2076mIV infusion (0.1 mg/ml)  3-15 mg/hr Intravenous Continuous RaBrand MalesMD   Stopped at 09/20/2017 1200  . pantoprazole (PROTONIX) EC tablet 40 mg  40 mg Oral Daily RaBrand MalesMD   40 mg at 09/29/2017 1134  . sodium chloride flush (NS) 0.9 % injection 10-40 mL  10-40 mL Intracatheter Q12H RaBrand MalesMD   10 mL at 09/14/2017 1035  . sodium chloride flush (NS) 0.9 % injection 10-40 mL  10-40 mL Intracatheter PRN RaBrand MalesMD        REVIEW OF SYSTEMS:  On review of systems, the patient is unable to provide a review of systems but in reviewing the chart and discussing the patient's case with his family, he was having shortness of breath prior to his admission which prompted his assessment.    PHYSICAL EXAM:  Wt Readings from Last 3 Encounters:  09/04/2017 183 lb (83 kg)  08/19/17 190 lb (86.2 kg)  06/15/17 197 lb (89.4 kg)   Temp Readings from Last 3 Encounters:  09/17/2017 98 F (36.7 C) (Axillary)  08/12/17 98.3 F (36.8 C) (Oral)  06/15/17 98.1 F (36.7 C) (Oral)   BP Readings from Last 3 Encounters:  09/23/2017 (!) 163/47  08/19/17 126/80  08/12/17 (!) 160/80   Pulse Readings from Last 3 Encounters:  09/16/2017 (!) 111  08/19/17 (!) 110  08/12/17 91   Pain Assessment Pain Score: Asleep/10  In general this  is a acutely ill Caucasian male in no acute distress. He is sedated and ventilator dependant. Skin is intact without any evidence of gross lesions. Chest reveals coarse breath sounds at the bedside.   KPS = 20 100 - Normal; no complaints; no evidence of disease. 90   - Able to carry on normal activity; minor signs or symptoms of disease. 80   - Normal activity with effort; some signs or symptoms of disease. 6   - Cares for self; unable to carry on normal activity or to do active work. 60   - Requires occasional  assistance, but is able to care for most of his personal needs. 50   - Requires considerable assistance and frequent medical care. 69   - Disabled; requires special care and assistance. 79   - Severely disabled; hospital admission is indicated although death not imminent. 32   - Very sick; hospital admission necessary; active supportive treatment necessary. 10   - Moribund; fatal processes progressing rapidly. 0     - Dead  Karnofsky DA, Abelmann Bath, Craver LS and Burchenal [facility] [phone]) The use of the nitrogen mustards in the palliative treatment of carcinoma: with particular reference to bronchogenic carcinoma Cancer 1 634-56  LABORATORY DATA:  Lab Results  Component Value Date   WBC 25.5 (H) 09/10/2017   HGB 11.3 (L) 09/26/2017   HCT 36.0 (L) 09/24/2017   MCV 89.8 09/20/2017   PLT 287 09/20/2017   Lab Results  Component Value Date   NA 145 09/28/2017   K 4.2 09/23/2017   CL 115 (H) 09/27/2017   CO2 21 (L) 09/22/2017   Lab Results  Component Value Date   ALT 17 08/31/2017   AST 21 08/31/2017   ALKPHOS 151 (H) 08/31/2017   BILITOT 0.9 08/31/2017     RADIOGRAPHY: Dg Chest 2 View  Result Date: 09/22/2017 CLINICAL DATA:  Lung cancer. EXAM: CHEST  2 VIEW COMPARISON:  04/04/2015 FINDINGS: Two-view chest shows right lower lobe pulmonary mass lesion with diffuse right middle lobe opacity and small right pleural effusion. The cardio pericardial silhouette is enlarged. Left lower lobe granuloma stable in the interval. There is pulmonary vascular congestion without overt pulmonary edema. The visualized bony structures of the thorax are intact. Telemetry leads overlie the chest. IMPRESSION: Right lower lobe pulmonary mass with probable right middle lobe atelectasis and small right pleural effusion. Imaging features compatible with the reported clinical history of lung cancer. Electronically Signed   By: Misty Stanley M.D.   On: 09/16/2017 11:02   Dg Abd 1 View  Result Date:  09/18/2017 CLINICAL DATA:  83 year old male with a history of nasogastric tube placement EXAM: ABDOMEN - 1 VIEW COMPARISON:  08/31/2017 FINDINGS: Limited images of upper abdomen. Nasogastric tube terminates in the left upper quadrant. The side port of the nasogastric tube appears beyond the region of the GE junction, below the diaphragms. Gas within stomach and colon. IMPRESSION: Limited plain film abdomen demonstrates nasogastric tube terminating in the stomach, with the side-port appearing to be beyond the region of the GE junction. Electronically Signed   By: Corrie Mckusick D.O.   On: 09/03/2017 10:50   Dg Abd 1 View  Result Date: 08/31/2017 CLINICAL DATA:  OG tube placement. EXAM: ABDOMEN - 1 VIEW COMPARISON:  None. FINDINGS: OG tube appears adequately positioned in the stomach. Proximal side holes are approximately 5 cm below the gastroesophageal junction. Visualized bowel gas pattern is nonobstructive. IMPRESSION: Enteric tube appears adequately positioned in the stomach. Electronically  Signed   By: Franki Cabot M.D.   On: 08/31/2017 19:14   Ct Angio Chest Pe W And/or Wo Contrast  Result Date: 09/22/2017 CLINICAL DATA:  Lung cancer.  Short of breath. EXAM: CT ANGIOGRAPHY CHEST WITH CONTRAST TECHNIQUE: Multidetector CT imaging of the chest was performed using the standard protocol during bolus administration of intravenous contrast. Multiplanar CT image reconstructions and MIPs were obtained to evaluate the vascular anatomy. CONTRAST:  155m ISOVUE-370 IOPAMIDOL (ISOVUE-370) INJECTION 76% COMPARISON:  07/31/2010 FINDINGS: Cardiovascular: There are no filling defects in the pulmonary arterial tree to suggest acute pulmonary thromboembolism. Atherosclerotic calcifications of the aortic arch are present. No evidence of aortic dissection or aneurysm. Great vessels are grossly patent. There is narrowing of several of the central right pulmonary arteries and veins secondary to malignancy. Mild 3 vessel coronary  artery calcification. Mediastinum/Nodes: There is abnormal confluence soft tissue mass throughout the mediastinum worrisome for malignancy. 5.5 x 6.7 cm subcarinal mass extending into the precarinal and right hilar regions. This is also continuous with a 3.3 x 2.7 cm right peritracheal mass. 2.0 cm prevascular lymph node on image 27. 1.6 cm high right paratracheal lymph node. Other smaller scattered lymph nodes are noted. No pericardial effusion. Visualized thyroid is unremarkable. The esophagus extends through the subcarinal mass and becomes imperceptible. Malignant involvement of the esophagus cannot be excluded. See image 42 of series 4 abnormal soft tissue extends into the left hilar region. See image 42 of series 4 Lungs/Pleura: 4.9 x 3.6 cm mass in the superior segment right lower lobe is associated with surrounding patchy and interstitial opacities. Findings are worrisome for malignancy and possible lymphangitic spread of tumor. There is a second smaller more central right lower lobe lung mass measuring 1.5 cm on image 95 of series 6. Left lung is clear other than calcified granulomata and subsegmental atelectasis. There is an associated small right pleural effusion. Confluent mass extending from the mediastinum into the right hilum causes severe narrowing of the right mainstem bronchus and right lung lobar airways. The right middle lobe airway is occluded and there is collapse of the right middle lobe. Upper Abdomen: No acute abnormality. Musculoskeletal: There is a aggressive destructive lesion in the manubrium worrisome for metastatic skeletal involvement of malignancy. T12 mA angioma has a benign appearance. Review of the MIP images confirms the above findings. IMPRESSION: No evidence of pulmonary thromboembolism. Right lower lobe lung mass associated with mediastinal and bilateral hilar adenopathy. Mediastinal adenopathy is confluent, extends in of the right hilum, and causes narrowing of airways as well  as pulmonary vessel branches. Right pleural effusion. Right middle lobe collapse secondary to right middle lobe airway occlusion. Possible lymphangitic tumor in the right lower lobe. Possible involvement of the midesophagus. 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 Biopsy should provide diagnostic material. Aortic Atherosclerosis (ICD10-I70.0). Electronically Signed   By: AMarybelle KillingsM.D.   On: 09/26/2017 13:10   [place] (ple[place] Effusion[place]  Result Date: 08/31/2017 CLINICAL DATA:  SMALL RIGHT PLEURAL EFFUSION EXAM: CHEST ULTRASOUND COMPARISON:  09/25/2017 FINDINGS: Ultrasound performed the right chest with the patient right side up decubitus. Small right effusion noted. There is not enough fluid to warrant thoracentesis for therapeutic purposes. Procedure not performed. IMPRESSION: Small right pleural effusion.  Thoracentesis not performed. Electronically Signed   By: MJerilynn Mages  Shick M.D.   On: 08/31/2017 10:55   Dg Chest Port 1 View  Result Date: 09/04/2017 CLINICAL DATA:  Endotracheal tube and NG tube  removal. EXAM: PORTABLE CHEST 1 VIEW COMPARISON:  09/03/2017 and prior exams FINDINGS: The cardiomediastinal silhouette is unchanged. Mediastinal and right hilar fullness and poorly visualized right lower lung mass again identified. The endotracheal tube and NG tube have been removed. A right PICC line with tip overlying the lower SVC again noted. No pneumothorax or large pleural effusion. No other significant change. IMPRESSION: Endotracheal tube and NG tube removal without other significant change. Electronically Signed   By: Margarette Canada M.D.   On: 09/04/2017 07:27   Dg Chest Port 1 View  Result Date: 09/03/2017 CLINICAL DATA:  Pneumonia, diabetes. EXAM: PORTABLE CHEST 1 VIEW COMPARISON:  09/02/2017. FINDINGS: Normal cardiac size. Tubes and lines appear stable. RIGHT perihilar opacity is unchanged. IMPRESSION: Stable exam. Electronically Signed   By: Staci Righter M.D.   On: 09/03/2017 07:31   Portable Chest Xray  Result Date: 09/02/2017 CLINICAL DATA:  Intubated, enteric tube placement EXAM: PORTABLE CHEST 1 VIEW COMPARISON:  Chest radiograph from one day prior. FINDINGS: Endotracheal tube tip is 3.7 cm above the carina. Enteric tube enters stomach with the tip not seen on this image. Right PICC terminates at the cavoatrial junction. Stable cardiomediastinal silhouette with normal heart size and enlargement of the lower right hilum. The lower trachea and mainstem bronchi appeared diminutive, unchanged. No pneumothorax. Trace bilateral pleural effusions, stable. Stable right lower lobe lung mass and right middle lobe atelectasis. Stable left lung base atelectasis. No pulmonary edema. IMPRESSION: 1. Well-positioned lines and tubes. 2. Stable diminutive appearing lower trachea and mainstem bronchi due to known extrinsic compression by bulky mediastinal adenopathy. 3. Stable right middle lobe atelectasis obscuring known right lower lobe lung mass. 4. Stable trace bilateral pleural effusions and left basilar atelectasis. Electronically Signed   By: Ilona Sorrel M.D.   On: 09/02/2017 12:55   Dg Chest Port 1 View  Result Date: 09/01/2017 CLINICAL DATA:  Intubation. EXAM: PORTABLE CHEST 1 VIEW COMPARISON:  08/31/2017.  CT 09/25/2017. FINDINGS: Endotracheal tube and NG tube in stable position. Mediastinal fullness consistent with known adenopathy noted. Right hilar/infrahilar mass again noted. No pleural effusion or pneumothorax. IMPRESSION: 1. Lines and tubes stable position. 2. Findings consist with mediastinal adenopathy and right hilar/ infrahilar mass again noted. No interim change from prior exams. Electronically Signed   By: Marcello Moores  Register   On: 09/01/2017 06:18   Dg Chest Port 1 View  Result Date: 08/31/2017 CLINICAL DATA:  Endotracheal tube EXAM: PORTABLE CHEST 1 VIEW COMPARISON:  Chest x-ray from earlier same day. Chest CT dated 09/20/2017. FINDINGS:  Endotracheal tube appears well positioned with tip approximately 3 cm above the carina. Heart size and mediastinal contours are stable. Right perihilar/infrahilar mass is stable in the short-term interval. No new lung findings. No pneumothorax seen. IMPRESSION: Endotracheal tube appears well positioned with tip approximately 3 cm above the carina. No other interval change compared to today's earlier chest x-ray and CT findings. Electronically Signed   By: Franki Cabot M.D.   On: 08/31/2017 19:14   Dg Chest Port 1 View  Result Date: 08/31/2017 CLINICAL DATA:  Respiratory distress.  History of lung cancer. EXAM: PORTABLE CHEST 1 VIEW COMPARISON:  Chest radiographs and CT 09/20/2017 FINDINGS: The cardiac silhouette is normal in size. Right paratracheal and hilar density corresponds to lymphadenopathy demonstrated on CT. Medial right lung base opacity is unchanged from the recent radiographs and corresponds to the known right lower lobe mass and right middle lobe collapse. A calcified granuloma is again noted in the  left lung, which is otherwise clear. No large pleural effusion or pneumothorax is identified. IMPRESSION: Unchanged appearance of the chest. Persistent right middle lobe collapse in the setting of right lower lobe mass and lymphadenopathy. Electronically Signed   By: Logan Bores M.D.   On: 08/31/2017 14:14      IMPRESSION/PLAN: 1. Ventilator dependant 82 y.o. gentleman with probable locally advanced lung cancer. The patient's case was discussed and we met today with the family to review the imaging findings and the likelihood of him having at least Stage III Lung cancer. Dr. Tammi Klippel outlined the rationale to try and stabilize and improve his airway with radiotherapy with the hopes of his extubation in the future.  We discussed the risks, benefits, short, and long term effects of radiotherapy, and the patient is interested in proceeding. Dr. Tammi Klippel discusses the delivery and logistics of  radiotherapy and anticipates a course of 2 weeks. Written consent from the patient's wife is obtained and placed in the chart, a copy was provided to them. He will simulate today and start treatment this afternoon as well.   In a visit lasting 70 minutes, greater than 50% of the time was spent face to face with family and in floor time discussing his case, and coordinating the patient's care.      Carola Rhine, [facility]   Page Me    Seen with _____________________________________  Sheral Apley Tammi Klippel, M.D.

## 2017-09-06 ENCOUNTER — Inpatient Hospital Stay (HOSPITAL_COMMUNITY): Payer: Medicare Other

## 2017-09-06 ENCOUNTER — Ambulatory Visit
Admit: 2017-09-06 | Discharge: 2017-09-06 | Disposition: A | Discharge status: Home / Self Care | Payer: Medicare Other | Attending: Radiation Oncology | Admitting: Radiation Oncology

## 2017-09-06 ENCOUNTER — Encounter (HOSPITAL_COMMUNITY): Payer: Self-pay | Admitting: Pulmonary Disease

## 2017-09-06 DIAGNOSIS — C3400 Malignant neoplasm of unspecified main bronchus: Secondary | ICD-10-CM

## 2017-09-06 LAB — CBC WITH DIFFERENTIAL/PLATELET
BASOS ABS: 0 10*3/uL (ref 0.0–0.1)
Basophils Absolute: 0 10*3/uL (ref 0.0–0.1)
Basophils Relative: 0 %
Basophils Relative: 0 %
EOS ABS: 0 10*3/uL (ref 0.0–0.7)
EOS ABS: 0 10*3/uL (ref 0.0–0.7)
EOS PCT: 0 %
Eosinophils Relative: 0 %
HCT: 32.1 % — ABNORMAL LOW (ref 39.0–52.0)
HCT: 33.7 % — ABNORMAL LOW (ref 39.0–52.0)
HEMOGLOBIN: 10.6 g/dL — AB (ref 13.0–17.0)
Hemoglobin: 10 g/dL — ABNORMAL LOW (ref 13.0–17.0)
LYMPHS ABS: 1 10*3/uL (ref 0.7–4.0)
LYMPHS ABS: 1.3 10*3/uL (ref 0.7–4.0)
LYMPHS PCT: 6 %
LYMPHS PCT: 6 %
MCH: 28.4 pg (ref 26.0–34.0)
MCH: 28.6 pg (ref 26.0–34.0)
MCHC: 31.2 g/dL (ref 30.0–36.0)
MCHC: 31.5 g/dL (ref 30.0–36.0)
MCV: 91.2 fL (ref 78.0–100.0)
MCV: 91.1 fL (ref 78.0–100.0)
MONO ABS: 0.4 10*3/uL (ref 0.1–1.0)
MONOS PCT: 5 %
Monocytes Absolute: 1.1 10*3/uL — ABNORMAL HIGH (ref 0.1–1.0)
Monocytes Relative: 3 %
NEUTROS ABS: 17.8 10*3/uL — AB (ref 1.7–7.7)
NEUTROS PCT: 89 %
Neutro Abs: 14.2 10*3/uL — ABNORMAL HIGH (ref 1.7–7.7)
Neutrophils Relative %: 91 %
PLATELETS: 214 10*3/uL (ref 150–400)
Platelets: 224 10*3/uL (ref 150–400)
RBC: 3.52 MIL/uL — ABNORMAL LOW (ref 4.22–5.81)
RBC: 3.7 MIL/uL — AB (ref 4.22–5.81)
RDW: 16.2 % — AB (ref 11.5–15.5)
RDW: 16 % — ABNORMAL HIGH (ref 11.5–15.5)
WBC: 15.7 10*3/uL — ABNORMAL HIGH (ref 4.0–10.5)
WBC: 20.2 10*3/uL — AB (ref 4.0–10.5)

## 2017-09-06 LAB — GLUCOSE, CAPILLARY
GLUCOSE-CAPILLARY: 232 mg/dL — AB (ref 65–99)
GLUCOSE-CAPILLARY: 201 mg/dL — AB (ref 65–99)
GLUCOSE-CAPILLARY: 117 mg/dL — AB (ref 65–99)
Glucose-Capillary: 277 mg/dL — ABNORMAL HIGH (ref 65–99)
Glucose-Capillary: 277 mg/dL — ABNORMAL HIGH (ref 65–99)
Glucose-Capillary: 224 mg/dL — ABNORMAL HIGH (ref 65–99)

## 2017-09-06 LAB — BASIC METABOLIC PANEL
Anion gap: 6 (ref 5–15)
BUN: 47 mg/dL — AB (ref 6–20)
CHLORIDE: 119 mmol/L — AB (ref 101–111)
CO2: 22 mmol/L (ref 22–32)
CREATININE: 1.01 mg/dL (ref 0.61–1.24)
Calcium: 8.7 mg/dL — ABNORMAL LOW (ref 8.9–10.3)
GFR calc Af Amer: >60 mL/min (ref >60)
GFR calc non Af Amer: >60 mL/min (ref >60)
Glucose, Bld: 288 mg/dL — ABNORMAL HIGH (ref 65–99)
Potassium: 3.5 mmol/L (ref 3.5–5.1)
Sodium: 147 mmol/L — ABNORMAL HIGH (ref 135–145)

## 2017-09-06 LAB — MAGNESIUM
Magnesium: 2.2 mg/dL (ref 1.7–2.4)
Magnesium: 2.4 mg/dL (ref 1.7–2.4)

## 2017-09-06 LAB — TROPONIN I: Troponin I: <0.03 ng/mL (ref <0.03)

## 2017-09-06 LAB — PHOSPHORUS
Phosphorus: 3.9 mg/dL (ref 2.5–4.6)
Phosphorus: 4.1 mg/dL (ref 2.5–4.6)

## 2017-09-06 MED ORDER — DEXMEDETOMIDINE HCL IN NACL 200 MCG/50ML IV SOLN: 0.0000 ug/kg/h | INTRAVENOUS | Status: DC

## 2017-09-06 MED ORDER — DEXMEDETOMIDINE HCL IN NACL 400 MCG/100ML IV SOLN
0.0000 ug/kg/h | INTRAVENOUS | Status: DC
  Administered 2017-09-06 – 2017-09-08 (×7): 1.2 ug/kg/h via INTRAVENOUS
  Administered 2017-09-08: 0.5 ug/kg/h via INTRAVENOUS
  Administered 2017-09-08: 1.2 ug/kg/h via INTRAVENOUS
  Administered 2017-09-09: 0.6 ug/kg/h via INTRAVENOUS
  Filled 2017-09-06: qty 200
  Filled 2017-09-06 (×5): qty 100
  Filled 2017-09-06: qty 200
  Filled 2017-09-06 (×4): qty 100

## 2017-09-06 MED ORDER — PANTOPRAZOLE SODIUM 40 MG PO PACK
40.0000 mg | PACK | Freq: Every day | ORAL | Status: DC
  Administered 2017-09-06 – 2017-09-08 (×3): 40 mg
  Filled 2017-09-06 (×2): qty 20

## 2017-09-06 MED ORDER — NITROGLYCERIN 2 % TD OINT
1.0000 [in_us] | TOPICAL_OINTMENT | Freq: Four times a day (QID) | TRANSDERMAL | Status: DC
  Administered 2017-09-06 – 2017-09-09 (×13): 1 [in_us] via TOPICAL
  Filled 2017-09-06: qty 30

## 2017-09-06 MED ORDER — DEXMEDETOMIDINE HCL IN NACL 400 MCG/100ML IV SOLN: 0.0000 ug/kg/h | INTRAVENOUS | Status: DC

## 2017-09-06 MED ORDER — NITROGLYCERIN 0.4 MG SL SUBL
0.4000 mg | SUBLINGUAL_TABLET | SUBLINGUAL | Status: DC | PRN
Start: 2017-09-06 — End: 2017-09-09

## 2017-09-06 NOTE — Progress Notes (Signed)
PULMONARY / CRITICAL CARE MEDICINE   Name: Henry [name] MRN: [id number] DOB: 1935/05/06    ADMISSION DATE:  09/19/2017 CONSULTATION DATE:  08/31/2017  REFERRING MD:  Dyann Kief  CHIEF COMPLAINT:  dyspnea  HISTORY OF PRESENT ILLNESS:   83 y/o male with a recently diagnosed lung mass presented to the hospital with dyspnea.  He was intubated, self extubated, remained short of breath.   PAST MEDICAL HISTORY :  He  has a past medical history of Anemia, Anisocoria, Diabetes mellitus, GERD (gastroesophageal reflux disease), Hyperlipidemia, Other abnormal glucose, and Vitamin D deficiency.   REVIEW OF SYSTEMS:   As above  SUBJECTIVE:  Evaluated by Medical and Radiation Oncology yesterday, plan urgent radiation therapy EBUS yesterday showed carcinoma  VITAL SIGNS: BP (!) 150/68   Pulse 89   Temp 97.6 F (36.4 C) (Axillary)   Resp 11   Ht 5\' 6"  (1.676 m)   Wt 84.2 kg (185 lb 10 oz)   SpO2 94%   BMI 29.96 kg/m   HEMODYNAMICS:    VENTILATOR SETTINGS: Vent Mode: PRVC FiO2 (%):  [40 %] 40 % Set Rate:  [14 bmp] 14 bmp Vt Set:  [510 mL] 510 mL PEEP:  [5 cmH20] 5 cmH20 Plateau Pressure:  [15 cmH20-20 cmH20] 16 cmH20  INTAKE / OUTPUT: I/O last 3 completed shifts: In: 2892.5 [I.V.:1805.7; NG/GT:936.8; IV Piggyback:150] Out: 1062 [Urine:4175]  PHYSICAL EXAMINATION:  General:  In bed on vent HENT: NCAT ETT in place PULM: Diminished R lung, vent supported breathing CV: RRR, no mgr GI: BS+, soft, nontender MSK: normal bulk and tone Neuro: awake on vent, passing SBT     LABS:  BMET Recent Labs  Lab 09/03/17 2342 08/31/2017 0441 09/06/17 0353  NA 140 145 147*  K 4.0 4.2 3.5  CL 109 115* 119*  CO2 23 21* 22  BUN 29* 45* 47*  CREATININE 0.97 1.10 1.01  GLUCOSE 193* 176* 288*    Electrolytes Recent Labs  Lab 09/03/17 2342 09/15/2017 0441 09/24/2017 1145 09/21/2017 1700 09/06/17 0353  CALCIUM 9.3 9.3  --   --  8.7*  MG  --  2.4 2.4 2.3 2.2  PHOS  --  4.8* 5.9*  5.3* 3.9    CBC Recent Labs  Lab 09/04/17 0402 09/04/2017 0441 09/06/17 0353  WBC 35.5* 25.5* 15.7*  HGB 11.8* 11.3* 10.0*  HCT 37.0* 36.0* 32.1*  PLT 312 287 214    Coag's No results for input(s): APTT, INR in the last 168 hours.  Sepsis Markers Recent Labs  Lab 09/04/2017 1525 08/31/17 1349 09/01/17 0319 09/02/17 0332  LATICACIDVEN 2.98* 2.1* 2.6*  --   PROCALCITON  --  0.19 0.12 0.11    ABG Recent Labs  Lab 08/31/17 1905 09/02/17 1351 09/26/2017 0945  PHART 7.443 7.272* 7.358  PCO2ART 38.8 54.7* 37.9  PO2ART 131* 112* 68.0*    Liver Enzymes Recent Labs  Lab 08/31/17 0417  AST 21  ALT 17  ALKPHOS 151*  BILITOT 0.9  ALBUMIN 3.3*    Cardiac Enzymes Recent Labs  Lab 09/04/17 0930 09/04/17 1524 09/06/17 0353  TROPONINI 0.03* <0.03 <0.03    Glucose Recent Labs  Lab 09/27/2017 1111 09/14/2017 1547 09/01/2017 2021 09/26/2017 2336 09/06/17 0311 09/06/17 0718  GLUCAP 196* 216* 239* 202* 277* 232*    Imaging Dg Abd 1 View  Result Date: 09/14/2017 CLINICAL DATA:  83 year old male with a history of nasogastric tube placement EXAM: ABDOMEN - 1 VIEW COMPARISON:  08/31/2017 FINDINGS: Limited images of upper abdomen. Nasogastric tube  terminates in the left upper quadrant. The side port of the nasogastric tube appears beyond the region of the GE junction, below the diaphragms. Gas within stomach and colon. IMPRESSION: Limited plain film abdomen demonstrates nasogastric tube terminating in the stomach, with the side-port appearing to be beyond the region of the GE junction. Electronically Signed   By: Corrie Mckusick D.O.   On: 09/17/2017 10:50   Dg Chest Port 1 View  Result Date: 09/06/2017 CLINICAL DATA:  Respiratory failure. EXAM: PORTABLE CHEST 1 VIEW COMPARISON:  09/04/2017.  09/03/2017.  CT 09/12/2017. FINDINGS: Interim intubation, endotracheal tube tip 2.6 cm above the carina. Interim placement NG tube, its tip is below left hemidiaphragm. Right PICC line in stable  position. Mediastinal fullness and hilar fullness again noted consistent known adenopathy. Right base mass again noted. Small right pleural effusion again noted. No pneumothorax. IMPRESSION: 1. Interim intubation, endotracheal tube tip is 2.6 cm above the carina. Interim placement of NG tube, its tip is below the left hemidiaphragm. Right PICC line in stable position. 2. Mediastinal and hilar fullness again noted consistent with known adenopathy. Right lung base mass again noted. Small right pleural effusion again noted. These findings are unchanged. Electronically Signed   By: Marcello Moores  Register   On: 09/06/2017 07:18    SIGNIFICANT EVENTS: 09/07/2017 - admit  Been to [facility] on 23-Sep-2017 for shortness of breath. CT of the chest right lower lobe mass and small pleural effusion. 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  He is a life long non-smoker.   ASSESSMENT / PLAN:  PULMONARY A: Acute respiratory failure with hypoxemia Large Right Lower Lung mass with bulky mediastinal lymphadenopathy Extensive airway burden from carcinoma, particularly in the R lung > does well with positive pressure as this is a pneumatic stent for the R mainstem P:   OK to continue pressure support ventilation today Currently vent dependent due to bulk from R lung airway disease, hoping for rapid response to XRT May consider  transitioning to BIPAP on 1/9 if he is improving VAP prevention Daily WUA/SBT Radiation therapy directed to right lung/airway   CARDIOVASCULAR A:  Sinus tachycardia P:  Tele Monitor hemodynamics  RENAL A:   No acute issues P:   Monitor BMET and UOP Replace electrolytes as needed   GASTROINTESTINAL A:   GERD P:   Continue PPI Continue tube feeding   HEMATOLOGIC A:   Mild anemia, normocytic P:  Monitor for bleeding  INFECTIOUS A:   Post obstructive pneumonia > treated with IV antibiotics P:   Stop cefepime  ENDOCRINE A:   Hyperglycemia on steroids  P:   Continue SSI q4  NEUROLOGIC A:   No acute issues P:   RASS goal: -1 to -2 precedex and fentanyl and versed per protocol  FAMILY  - Updates: updated daughter at length on 1/8; I advised that we really can't expect to continue  full vent support for a long period of time, perhaps could transition to BIPAP while treating with XRT.  I made it clear that this represents a poor prognosis and they understand that.  The daughter is asking that we discuss issues with both the wife and his children as his wife admits that she often gets confused when talking to doctors.  They are asking for Dr. Marin Olp to see the patient.   - Inter-disciplinary family meet or Palliative Care meeting due by:  day 7  My cc time 40 minutes  Roselie Awkward, MD [place] [facility] Pager: [phone] Cell: [phone] After 3pm or if no response, call [phone]   09/06/2017, 8:27 AM

## 2017-09-06 NOTE — Progress Notes (Signed)
[place] Progress Note Patient Name: Henry [name] DOB: 1934/11/10 MRN: [id number]   Date of Service  09/06/2017  HPI/Events of Note  Patient c/o chest pain - BP = 152/77.   eICU Interventions  Will order: 1. 12 Lead EKG STAT. 2. Cycle Troponin.  3. NTG 0.4 mg SL Q 5 minutes PRN chest pain.  4. Nitroglycerin paste 1 inch to chest wall now and Q 6 hours. Hold for SBP < 105.      Intervention Category Major Interventions: Other:  Lysle Dingwall 09/06/2017, 3:49 AM

## 2017-09-06 NOTE — Progress Notes (Signed)
I went to see pt, pt's family has contacted my partner Dr. Marin Olp, who they know from their church. Dr. Marin Olp will see pt tomorrow. Biopsy result is still pending, I answered some of their questions, will sign off for now.   Henry [name]  09/06/2017

## 2017-09-06 NOTE — Progress Notes (Signed)
Inpatient Diabetes Program Recommendations  AACE/ADA: New Consensus Statement on Inpatient Glycemic Control (2015)  Target Ranges:  Prepandial:   less than 140 mg/dL      Peak postprandial:   less than 180 mg/dL (1-2 hours)      Critically ill patients:  140 - 180 mg/dL   Lab Results  Component Value Date   GLUCAP 232 (H) 09/06/2017   HGBA1C 6.7 (H) 09/26/2017    Review of Glycemic Control  Blood sugars > 180 mg/dL Insulin adjustment needed. May benefit from low-dose basal insulin Continues on Solumedrol 60 mg Q12H  Inpatient Diabetes Program Recommendations:   Add Lantus 10 units QHS Add Novolog 3 units Q4H for TF coverage Continue with Novolog 0-15 units Q4H.  Will continue to follow.  Thank you. Lorenda Peck, RD, LDN, CDE Inpatient Diabetes Coordinator [phone]

## 2017-09-07 ENCOUNTER — Ambulatory Visit
Admit: 2017-09-07 | Discharge: 2017-09-07 | Disposition: A | Discharge status: Home / Self Care | Payer: Medicare Other | Attending: Radiation Oncology | Admitting: Radiation Oncology

## 2017-09-07 ENCOUNTER — Inpatient Hospital Stay (HOSPITAL_COMMUNITY): Payer: Medicare Other

## 2017-09-07 DIAGNOSIS — Z515 Encounter for palliative care: Secondary | ICD-10-CM

## 2017-09-07 DIAGNOSIS — E878 Other disorders of electrolyte and fluid balance, not elsewhere classified: Secondary | ICD-10-CM

## 2017-09-07 DIAGNOSIS — J9621 Acute and chronic respiratory failure with hypoxia: Secondary | ICD-10-CM

## 2017-09-07 LAB — CBC WITH DIFFERENTIAL/PLATELET
BASOS PCT: 0 %
Basophils Absolute: 0 10*3/uL (ref 0.0–0.1)
EOS ABS: 0 10*3/uL (ref 0.0–0.7)
EOS PCT: 0 %
HCT: 34.1 % — ABNORMAL LOW (ref 39.0–52.0)
Hemoglobin: 10.6 g/dL — ABNORMAL LOW (ref 13.0–17.0)
Lymphocytes Relative: 9 %
Lymphs Abs: 1.6 10*3/uL (ref 0.7–4.0)
MCH: 28.7 pg (ref 26.0–34.0)
MCHC: 31.1 g/dL (ref 30.0–36.0)
MCV: 92.4 fL (ref 78.0–100.0)
MONO ABS: 1.1 10*3/uL — AB (ref 0.1–1.0)
MONOS PCT: 6 %
Neutro Abs: 15.5 10*3/uL — ABNORMAL HIGH (ref 1.7–7.7)
Neutrophils Relative %: 85 %
Platelets: 196 10*3/uL (ref 150–400)
RBC: 3.69 MIL/uL — ABNORMAL LOW (ref 4.22–5.81)
RDW: 16.4 % — AB (ref 11.5–15.5)
WBC: 18.2 10*3/uL — ABNORMAL HIGH (ref 4.0–10.5)

## 2017-09-07 LAB — GLUCOSE, CAPILLARY
GLUCOSE-CAPILLARY: 188 mg/dL — AB (ref 65–99)
GLUCOSE-CAPILLARY: 148 mg/dL — AB (ref 65–99)
GLUCOSE-CAPILLARY: 216 mg/dL — AB (ref 65–99)
GLUCOSE-CAPILLARY: 192 mg/dL — AB (ref 65–99)
Glucose-Capillary: 193 mg/dL — ABNORMAL HIGH (ref 65–99)
Glucose-Capillary: 186 mg/dL — ABNORMAL HIGH (ref 65–99)

## 2017-09-07 LAB — BASIC METABOLIC PANEL
ANION GAP: 6 (ref 5–15)
ANION GAP: 6 (ref 5–15)
Anion gap: 6 (ref 5–15)
BUN: 52 mg/dL — AB (ref 6–20)
BUN: 51 mg/dL — ABNORMAL HIGH (ref 6–20)
BUN: 56 mg/dL — AB (ref 6–20)
CALCIUM: 8.9 mg/dL (ref 8.9–10.3)
CALCIUM: 8.8 mg/dL — AB (ref 8.9–10.3)
CHLORIDE: 129 mmol/L — AB (ref 101–111)
CO2: 24 mmol/L (ref 22–32)
CO2: 22 mmol/L (ref 22–32)
CO2: 23 mmol/L (ref 22–32)
Calcium: 8.9 mg/dL (ref 8.9–10.3)
Chloride: 126 mmol/L — ABNORMAL HIGH (ref 101–111)
Chloride: 128 mmol/L — ABNORMAL HIGH (ref 101–111)
Creatinine, Ser: 0.98 mg/dL (ref 0.61–1.24)
Creatinine, Ser: 0.93 mg/dL (ref 0.61–1.24)
Creatinine, Ser: 1.04 mg/dL (ref 0.61–1.24)
GFR calc Af Amer: >60 mL/min (ref >60)
GFR calc Af Amer: >60 mL/min (ref >60)
GLUCOSE: 201 mg/dL — AB (ref 65–99)
GLUCOSE: 210 mg/dL — AB (ref 65–99)
GLUCOSE: 212 mg/dL — AB (ref 65–99)
POTASSIUM: 3.4 mmol/L — AB (ref 3.5–5.1)
Potassium: 3.5 mmol/L (ref 3.5–5.1)
Potassium: 3.6 mmol/L (ref 3.5–5.1)
SODIUM: 158 mmol/L — AB (ref 135–145)
Sodium: 156 mmol/L — ABNORMAL HIGH (ref 135–145)
Sodium: 156 mmol/L — ABNORMAL HIGH (ref 135–145)

## 2017-09-07 LAB — MAGNESIUM: MAGNESIUM: 2.6 mg/dL — AB (ref 1.7–2.4)

## 2017-09-07 LAB — PHOSPHORUS: PHOSPHORUS: 3.8 mg/dL (ref 2.5–4.6)

## 2017-09-07 MED ORDER — FREE WATER
200.0000 mL | Freq: Four times a day (QID) | Status: DC
  Administered 2017-09-07 – 2017-09-08 (×4): 200 mL

## 2017-09-07 MED ORDER — METHYLPREDNISOLONE SODIUM SUCC 40 MG IJ SOLR
40.0000 mg | Freq: Two times a day (BID) | INTRAMUSCULAR | Status: DC
  Administered 2017-09-07 – 2017-09-09 (×5): 40 mg via INTRAVENOUS
  Filled 2017-09-07 (×5): qty 1

## 2017-09-07 NOTE — Progress Notes (Signed)
PULMONARY / CRITICAL CARE MEDICINE   Name: Henry [name] MRN: [id number] DOB: [birth year]    ADMISSION DATE:  09/26/2017 CONSULTATION DATE:  08/31/2017  REFERRING MD:  Dyann Kief  CHIEF COMPLAINT:  dyspnea  HISTORY OF PRESENT ILLNESS:   83 y/o male with a recently diagnosed lung mass presented to the hospital with dyspnea.  He was intubated, self extubated, remained short of breath.   PAST MEDICAL HISTORY :  He  has a past medical history of Anemia, Anisocoria, Diabetes mellitus, GERD (gastroesophageal reflux disease), Hyperlipidemia, Other abnormal glucose, and Vitamin D deficiency.   REVIEW OF SYSTEMS:   As above  SUBJECTIVE:  No acute events Receiving radiation treatment  VITAL SIGNS: BP 128/73 (BP Location: Left Arm)   Pulse 87   Temp 98.6 F (37 C) (Oral)   Resp (!) 22   Ht 5\' 6"  (1.676 m)   Wt 85.1 kg (187 lb 9.8 oz)   SpO2 97%   BMI 30.28 kg/m   HEMODYNAMICS:    VENTILATOR SETTINGS: Vent Mode: PRVC FiO2 (%):  [40 %] 40 % Set Rate:  [14 bmp] 14 bmp Vt Set:  [510 mL] 510 mL PEEP:  [5 cmH20] 5 cmH20 Pressure Support:  [5 cmH20] 5 cmH20 Plateau Pressure:  [20 cmH20-23 cmH20] 20 cmH20  INTAKE / OUTPUT: I/O last 3 completed shifts: In: 3746.5 [I.V.:1092.6; Other:90; NG/GT:2363.9; IV Piggyback:200] Out: 3750 [Urine:3750]  PHYSICAL EXAMINATION:  General:  In bed on vent HENT: NCAT ETT in place PULM: CTA B, vent supported breathing CV: RRR, no mgr GI: BS+, soft, nontender MSK: normal bulk and tone Neuro: awake on vent    LABS:  BMET Recent Labs  Lab 09/16/2017 0441 09/06/17 0353 09/07/17 0407  NA 145 147* 156*  K 4.2 3.5 3.5  CL 115* 119* 126*  CO2 21* 22 24  BUN 45* 47* 52*  CREATININE 1.10 1.01 0.98  GLUCOSE 176* 288* 201*    Electrolytes Recent Labs  Lab 09/26/2017 0441  09/06/17 0353 09/06/17 1548 09/07/17 0407  CALCIUM 9.3  --  8.7*  --  8.9  MG 2.4   < > 2.2 2.4 2.6*  PHOS 4.8*   < > 3.9 4.1 3.8   < > = values in this interval  not displayed.    CBC Recent Labs  Lab 09/06/17 0353 09/06/17 1143 09/07/17 0407  WBC 15.7* 20.2* 18.2*  HGB 10.0* 10.6* 10.6*  HCT 32.1* 33.7* 34.1*  PLT 214 224 196    Coag's No results for input(s): APTT, INR in the last 168 hours.  Sepsis Markers Recent Labs  Lab 08/31/17 1349 09/01/17 0319 09/02/17 0332  LATICACIDVEN 2.1* 2.6*  --   PROCALCITON 0.19 0.12 0.11    ABG Recent Labs  Lab 08/31/17 1905 09/02/17 1351 09/28/2017 0945  PHART 7.443 7.272* 7.358  PCO2ART 38.8 54.7* 37.9  PO2ART 131* 112* 68.0*    Liver Enzymes No results for input(s): AST, ALT, ALKPHOS, BILITOT, ALBUMIN in the last 168 hours.  Cardiac Enzymes Recent Labs  Lab 09/06/17 0353 09/06/17 0929 09/06/17 1548  TROPONINI <0.03 <0.03 <0.03    Glucose Recent Labs  Lab 09/06/17 1124 09/06/17 1508 09/06/17 1945 09/06/17 2322 09/07/17 0324 09/07/17 0722  GLUCAP 201* 117* 277* 224* 193* 188*    Imaging Dg Chest Port 1 View  Result Date: 09/07/2017 CLINICAL DATA:  Hypoxia EXAM: PORTABLE CHEST 1 VIEW COMPARISON:  September 06, 2017 chest radiograph and chest CT August 30, 2017 FINDINGS: Endotracheal tube tip is 2.6 cm above the  carina. Nasogastric tube tip and side port below the diaphragm. Central catheter tip is in the superior vena cava, unchanged. No pneumothorax evident. The known mass in the right lower lobe is again noted measuring approximately 5 x 4.5 cm. There is patchy consolidation in this area as well. There is bilateral hilar and mediastinal fullness consistent with adenopathy, better appreciated on recent CT examination. There is no edema or consolidation on the left. The heart is upper normal in size with pulmonary vascularity within normal limits. IMPRESSION: Tube and catheter positions as described without evident pneumothorax. Right lower lobe mass with extensive adenopathy again noted. Patchy consolidation in the right mid lung is larger and atelectasis in the region of the  mass. A degree of pneumonia superimposed on this area cannot be excluded. There is also atelectasis in the right base. There is no edema or consolidation on the left. Stable cardiac silhouette. Electronically Signed   By: Lowella Grip III M.D.   On: 09/07/2017 07:09    SIGNIFICANT EVENTS: 09/25/2017 - admit  Been to [facility] on October 03, 2017 for shortness of breath. CT of the chest right lower lobe mass and small pleural effusion. 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  He is a life long non-smoker.   ASSESSMENT / PLAN:  PULMONARY A: Acute respiratory failure with hypoxemia Large Right Lower Lung mass with bulky mediastinal lymphadenopathy Extensive airway burden from carcinoma, particularly in the R lung > does well with positive pressure as this is a pneumatic stent for the R mainstem P:   Would like to attempt extubation and use BIPAP prn, however he needs to be able to lay flat for radiation treatments; will attempt a T-piece trial today Decrease solumedrol today  CARDIOVASCULAR A:  Sinus tachycardia > resolved P:  Tele Monitor hemodynamics  RENAL A:   Hypernatremia> spurious? P:   Monitor BMET and UOP Replace electrolytes as needed Repeat BMET now  GASTROINTESTINAL A:   GERD P:   Continue PPI Continue tube feeding   HEMATOLOGIC A:   Mild anemia, normocytic P:  Monitor for  bleeding  INFECTIOUS A:   Post obstructive pneumonia > treated with IV antibiotics P:   Monitor off of antibiotics  ENDOCRINE A:   Hyperglycemia on steroids  P:   Continue SSI and q4h accuchecks  NEUROLOGIC A:   No acute issues P:   RASS goal: -1 to -2 precedex and fentanyl and versed per protocol For now  FAMILY  - Updates: family has been updated daily at length since 1/7, I will meet with them again today to discuss goals of care.   - Inter-disciplinary family meet or Palliative Care meeting due by:  day 7  My cc time 35 minutes  Roselie Awkward, MD [place] [facility] Pager: [phone] Cell: [phone] After 3pm or if no response, call [phone]   09/07/2017, 8:38 AM

## 2017-09-07 NOTE — Plan of Care (Signed)
  Interdisciplinary Goals of Care Family Meeting   Date carried out:: 09/07/2017  Location of the meeting: Bedside  Member's involved: Physician  Durable Power of Attorney or acting medical decision maker: Wife Henry [name], son and daughter.  We spent an extensive amount of time discussing that this is a difficult situation.  Mr. Jeffries is strong at baseline and recently built a deck approximately 6 weeks ago and [place].  This has been a rapid decline.  Currently he needs a pneumatic stent from positive pressure to continue to breathe.  After talking to the radiation oncology team they feel that it will likely take at least a week of therapy before we see any improvement.  After seeing him fail a T-piece trial today in the ICU after approximately 6 minutes I do not think that it is reasonable to expect that he could breathe with BiPAP alone.  The patient indicated that he wised to remain full code.   Discussion: We discussed goals of care for Henry [name] .  Remain full code, continue current management.  Code status: Full Code  Disposition: Continue current acute care  Time spent for the meeting: 40 minutes  Simonne Maffucci 09/07/2017, 4:00 PM

## 2017-09-07 NOTE — Consult Note (Signed)
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 Shortness of Breath  : Patient intubated.  HPI: Henry [name] is a 83 year old white male.  He actually has been a member of my church.  He is retired.  He apparently has never smoked.  He was admitted on January 1 with difficulty breathing.  He was known to have a mass in the right lung.  He was supposed to have a workup for this.  He began to have some shortness of breath after Thanksgiving.  He had a CT angiogram done when he was in the ER.  This showed a 5.5 x 6.7 cm subcarinal mass extending into the precarinal and right hilar regions.  He had a contiguous 3.3 x 2.7 cm right paratracheal mass.  He had a 2 cm prevascular lymph node.  He did have the 4.9 x 3.6 cm right lower lobe mass.  Also noted was a destructive manubrial mass.  He unfortunately had to be intubated for airway protection.  He underwent a bronchoscopy.  This was done on January 7.  The right mainstem bronchus was infiltrated with an endobronchial mass.  This caused severe narrowing.  At the left mainstem was normal.  However, there is a mass obstructing the lingula.  Multiple biopsies were taken.  The pathology report ([id number]) showed a poorly differentiated non-small cell carcinoma.  Amino chemical studies were nonspecific.  However, the pathologist favored a squamous cell cancer.  He has been seen by radiation oncology.  I am unsure if he started radiation therapy yet.  Again, he is intubated.  His labs today show sodium of 156.  His glucose is 201.  His creatinine is 0.98.  His calcium is 8.9.  His CBC showed a white cell count of 18.2.  Hemoglobin 10.6.  Platelet count 196,000.  Unfortunately, there is no one else present this morning.    Past Medical History:  Diagnosis Date  . Anemia    PMH of  . Anisocoria    congenital  .  Diabetes mellitus   . GERD (gastroesophageal reflux disease)   . Hyperlipidemia    mixed  . Other abnormal glucose   . Vitamin D deficiency   :  Past Surgical History:  Procedure Laterality Date  . bladder resection for cancer  1998   Dr Hartley Barefoot  . CATARACT EXTRACTION    . COLONOSCOPY  2006 & 2012   Dr Lajoyce Corners  . ENDOBRONCHIAL ULTRASOUND Bilateral 09/14/2017   Procedure: ENDOBRONCHIAL ULTRASOUND;  Surgeon: Juanito Doom, MD;  Location: [facility];  Service: Cardiopulmonary;  Laterality: Bilateral;  . PENILE PROSTHESIS IMPLANT  2011   Dr Hartley Barefoot  :   Current Facility-Administered Medications:  .  bisacodyl (DULCOLAX) suppository 10 mg, 10 mg, Rectal, Daily PRN, Anders Simmonds, MD .  chlorhexidine gluconate (MEDLINE KIT) (PERIDEX) 0.12 % solution 15 mL, 15 mL, Mouth Rinse, BID, McQuaid, Douglas B, MD, 15 mL at 09/06/17 2000 .  Chlorhexidine Gluconate Cloth 2 % PADS 6 each, 6 each, Topical, Daily, Brand Males, MD, 6 each at 09/07/17 0000 .  dexmedetomidine (PRECEDEX) 400 MCG/100ML (4 mcg/mL) infusion, 0-1.2 mcg/kg/hr, Intravenous, Continuous, Ramaswamy, Murali, MD, Last Rate: 24.9 mL/hr at 09/07/17 0400, 1.2 mcg/kg/hr at 09/07/17 0400 .  dextrose 5 %-0.45 % sodium chloride infusion, , Intravenous, Continuous, Ramaswamy, Murali, MD, Last Rate: 10 mL/hr at 09/07/17 0400 .  docusate (COLACE)  50 MG/5ML liquid 100 mg, 100 mg, Per Tube, BID PRN, Simonne Maffucci B, MD .  famotidine (PEPCID) IVPB 20 mg premix, 20 mg, Intravenous, Q12H, Juanito Doom, MD, Stopped at 09/07/17 0000 .  feeding supplement (PRO-STAT SUGAR FREE 64) liquid 30 mL, 30 mL, Per Tube, Daily, Simonne Maffucci B, MD, 30 mL at 09/06/17 0924 .  feeding supplement (VITAL AF 1.2 CAL) liquid 1,000 mL, 1,000 mL, Per Tube, Continuous, McQuaid, Douglas B, MD, Last Rate: 55 mL/hr at 09/07/17 0403, 1,000 mL at 09/07/17 0403 .  fentaNYL (SUBLIMAZE) injection 50 mcg, 50 mcg, Intravenous, Q2H PRN, Simonne Maffucci B, MD,  50 mcg at 09/07/17 0516 .  heparin injection 5,000 Units, 5,000 Units, Subcutaneous, Q8H, Brand Males, MD, 5,000 Units at 09/06/17 2359 .  hydrALAZINE (APRESOLINE) injection 10-20 mg, 10-20 mg, Intravenous, Q6H PRN, Byrum, Rose Fillers, MD .  insulin aspart (novoLOG) injection 0-15 Units, 0-15 Units, Subcutaneous, Q4H, Juanito Doom, MD, 3 Units at 09/07/17 0336 .  ipratropium-albuterol (DUONEB) 0.5-2.5 (3) MG/3ML nebulizer solution 3 mL, 3 mL, Nebulization, Q4H, Ramaswamy, Murali, MD, 3 mL at 09/07/17 0310 .  labetalol (NORMODYNE,TRANDATE) injection 20 mg, 20 mg, Intravenous, Q6H PRN, Brand Males, MD .  MEDLINE mouth rinse, 15 mL, Mouth Rinse, QID, McQuaid, Douglas B, MD, 15 mL at 09/07/17 0240 .  methylPREDNISolone sodium succinate (SOLU-MEDROL) 125 mg/2 mL injection 60 mg, 60 mg, Intravenous, Q12H, Ramaswamy, Murali, MD, 60 mg at 09/07/17 0141 .  midazolam (VERSED) injection 1 mg, 1 mg, Intravenous, Q15 min PRN, Simonne Maffucci B, MD, 1 mg at 09/07/17 0220 .  midazolam (VERSED) injection 1 mg, 1 mg, Intravenous, Q2H PRN, Simonne Maffucci B, MD, 1 mg at 09/07/17 0334 .  MUSCLE RUB CREA 1 application, 1 application, Topical, PRN, Barton Dubois, MD, 1 application at 49/44/96 0647 .  nicardipine (CARDENE) 79m in 0.86% saline 2064mIV infusion (0.1 mg/ml), 3-15 mg/hr, Intravenous, Continuous, RaBrand MalesMD, Stopped at 09/23/2017 1200 .  nitroGLYCERIN (NITROGLYN) 2 % ointment 1 inch, 1 inch, Topical, Q6H, SoAnders SimmondsMD, 1 inch at 09/07/17 0411 .  nitroGLYCERIN (NITROSTAT) SL tablet 0.4 mg, 0.4 mg, Sublingual, Q5 min PRN, SoAnders SimmondsMD .  pantoprazole sodium (PROTONIX) 40 mg/20 mL oral suspension 40 mg, 40 mg, Per Tube, Daily, RuDara HoyerRPH, 40 mg at 09/06/17 097591  sodium chloride flush (NS) 0.9 % injection 10-40 mL, 10-40 mL, Intracatheter, Q12H, RaBrand MalesMD, 20 mL at 09/06/17 2023 .  sodium chloride flush (NS) 0.9 % injection 10-40 mL, 10-40 mL,  Intracatheter, PRN, RaBrand MalesMD:  . chlorhexidine gluconate (MEDLINE KIT)  15 mL Mouth Rinse BID  . Chlorhexidine Gluconate Cloth  6 each Topical Daily  . feeding supplement (PRO-STAT SUGAR FREE 64)  30 mL Per Tube Daily  . heparin  5,000 Units Subcutaneous Q8H  . insulin aspart  0-15 Units Subcutaneous Q4H  . ipratropium-albuterol  3 mL Nebulization Q4H  . mouth rinse  15 mL Mouth Rinse QID  . methylPREDNISolone (SOLU-MEDROL) injection  60 mg Intravenous Q12H  . nitroGLYCERIN  1 inch Topical Q6H  . pantoprazole sodium  40 mg Per Tube Daily  . sodium chloride flush  10-40 mL Intracatheter Q12H  :  Allergies  Allergen Reactions  . Sulfa Drugs Cross Reactors     rash  :  Family History  Problem Relation Age of Onset  . Alcohol abuse Mother   . Stroke Mother 7226. COPD Father   .  Heart disease Brother 75       stents  . Diabetes Neg Hx   . Cancer Neg Hx   :  Social History   Socioeconomic History  . Marital status: Married    Spouse name: Not on file  . Number of children: Not on file  . Years of education: Not on file  . Highest education level: Not on file  Social Needs  . Financial resource strain: Not on file  . Food insecurity - worry: Not on file  . Food insecurity - inability: Not on file  . Transportation needs - medical: Not on file  . Transportation needs - non-medical: Not on file  Occupational History  . Not on file  Tobacco Use  . Smoking status: Never Smoker  . Smokeless tobacco: Never Used  Substance and Sexual Activity  . Alcohol use: Yes    Comment:  Rarely  . Drug use: No  . Sexual activity: Not on file  Other Topics Concern  . Not on file  Social History Narrative  . Not on file  :  Pertinent items are noted in HPI.  Exam: As stated in the above history Patient Vitals for the past 24 hrs:  BP Temp Temp src Pulse Resp SpO2 Weight  09/07/17 0335 - - - - - - 187 lb 9.8 oz (85.1 kg)  09/07/17 0326 - 98.7 F (37.1 C) Oral - - -  -  09/07/17 0310 129/72 - - 87 (!) 22 95 % -  09/07/17 0029 97/62 - - 91 (!) 22 96 % -  09/07/17 0000 97/62 - - 87 16 96 % -  09/06/17 2330 - 98.5 F (36.9 C) Oral - - - -  09/06/17 2300 131/65 - - 95 15 94 % -  09/06/17 2200 128/68 - - 95 15 95 % -  09/06/17 2100 131/72 - - 97 16 97 % -  09/06/17 2000 135/62 - - (!) 104 15 94 % -  09/06/17 1951 - 98.5 F (36.9 C) Oral - - - -  09/06/17 1921 113/66 - - 95 18 96 % -  09/06/17 1900 113/66 - - 94 14 97 % -  09/06/17 1800 (!) 126/58 - - 96 14 95 % -  09/06/17 1700 128/72 - - 93 15 95 % -  09/06/17 1600 (!) 141/92 - - 95 19 96 % -  09/06/17 1540 - 97.7 F (36.5 C) Axillary - - - -  09/06/17 1340 (!) 169/79 - - (!) 128 (!) 22 98 % -  09/06/17 1300 (!) 235/86 - - (!) 109 - 100 % -  09/06/17 1224 - - - - - 96 % -  09/06/17 1211 - - - - - 98 % -  09/06/17 1200 (!) 172/81 - - 99 - 97 % -  09/06/17 1130 - 97.7 F (36.5 C) Axillary - - - -  09/06/17 1100 (!) 160/86 - - (!) 102 - 97 % -  09/06/17 1000 (!) 143/70 - - (!) 105 - 95 % -  09/06/17 0908 - - - - - 100 % -  09/06/17 0904 - - - - - 99 % -  09/06/17 0900 (!) 153/73 - - 87 - 100 % -  09/06/17 0800 (!) 156/68 - - 89 - 99 % -  09/06/17 0732 - 97.6 F (36.4 C) Axillary - - - -     Recent Labs    09/06/17 1143 09/07/17 0407  WBC 20.2* 18.2*  HGB 10.6* 10.6*  HCT 33.7* 34.1*  PLT 224 196   Recent Labs    09/06/17 0353 09/07/17 0407  NA 147* 156*  K 3.5 3.5  CL 119* 126*  CO2 22 24  GLUCOSE 288* 201*  BUN 47* 52*  CREATININE 1.01 0.98  CALCIUM 8.7* 8.9    Blood smear review: None  Pathology: See above    Assessment and Plan: Mr. Nettle is a 83 year old white male.  He has stage IV non-small cell lung cancer.  Have a hard time believing that this is a squamous cell cancer given the fact that he is never smoked.  Adenocarcinoma would be much more likely.  I will send off the requisite genetic markers.  I will send off PD-L1.  I totally agree with the use of  emergent radiation.  I think this is the best option that we have right now.  I may consider some radiosensitizing treatment with mitomycin C.  This tends to work pretty well and is fairly well tolerated.  This is an obviously a very tough situation.  We have a situation that is definitely not curable.  He has bulky disease in a bad spot.  He has been in pretty good shape from what I can tell.  As such, I agree that we should try to be aggressive.  We will follow him along.  At some point he probably needs to have an MRI of the brain.  A PET scan would always be helpful.  However, this cannot be done with the patient in the hospital.  As always, I prayed for him.  I know that he would not mind this since he is a former church member of mine.  Lattie Haw, MD  [address] [place] 4:10

## 2017-09-07 NOTE — Care Management Note (Signed)
Case Management Note  Patient Details  Name: Henry [name] MRN: [id number] Date of Birth: [birth year]  Subjective/Objective:                  Resp, failure and intubated on full vent support.  Action/Plan: Date:  September 07, 2017 Chart reviewed for concurrent status and case management needs.  Will continue to follow patient progress.  Discharge Planning: following for needs  Expected discharge date: January 122019 Henry [name], BSN, [place], [place]   Expected Discharge Date:                  Expected Discharge Plan:  Home/Self Care  In-House Referral:     Discharge planning Services  CM Consult  Post Acute Care Choice:    Choice offered to:     DME Arranged:    DME Agency:     HH Arranged:    HH Agency:     Status of Service:  In process, will continue to follow  If discussed at Long Length of Stay Meetings, dates discussed:    Additional Comments:  Leeroy Cha, RN 09/07/2017, 9:35 AM

## 2017-09-08 ENCOUNTER — Ambulatory Visit (HOSPITAL_COMMUNITY): Admission: RE | Admit: 2017-09-08 | Payer: Medicare Other | Source: Ambulatory Visit

## 2017-09-08 ENCOUNTER — Ambulatory Visit
Admit: 2017-09-08 | Discharge: 2017-09-08 | Disposition: A | Discharge status: Home / Self Care | Payer: Medicare Other | Attending: Radiation Oncology | Admitting: Radiation Oncology

## 2017-09-08 ENCOUNTER — Inpatient Hospital Stay (HOSPITAL_COMMUNITY): Payer: Medicare Other

## 2017-09-08 DIAGNOSIS — E870 Hyperosmolality and hypernatremia: Secondary | ICD-10-CM

## 2017-09-08 LAB — BASIC METABOLIC PANEL
BUN: 56 mg/dL — AB (ref 6–20)
BUN: 62 mg/dL — ABNORMAL HIGH (ref 6–20)
CALCIUM: 9.3 mg/dL (ref 8.9–10.3)
CALCIUM: 9.2 mg/dL (ref 8.9–10.3)
CO2: 23 mmol/L (ref 22–32)
CO2: 22 mmol/L (ref 22–32)
CREATININE: 1.16 mg/dL (ref 0.61–1.24)
Chloride: >130 mmol/L (ref 101–111)
Chloride: >130 mmol/L (ref 101–111)
Creatinine, Ser: 1.28 mg/dL — ABNORMAL HIGH (ref 0.61–1.24)
GFR calc Af Amer: >60 mL/min (ref >60)
GFR calc non Af Amer: 51 mL/min — ABNORMAL LOW (ref >60)
GFR, EST AFRICAN AMERICAN: 59 mL/min — AB (ref >60)
GFR, EST NON AFRICAN AMERICAN: 58 mL/min — AB (ref >60)
Glucose, Bld: 219 mg/dL — ABNORMAL HIGH (ref 65–99)
Glucose, Bld: 334 mg/dL — ABNORMAL HIGH (ref 65–99)
POTASSIUM: 3.9 mmol/L (ref 3.5–5.1)
Potassium: 3.8 mmol/L (ref 3.5–5.1)
Sodium: 160 mmol/L — ABNORMAL HIGH (ref 135–145)
Sodium: 157 mmol/L — ABNORMAL HIGH (ref 135–145)

## 2017-09-08 LAB — CBC WITH DIFFERENTIAL/PLATELET
Basophils Absolute: 0 10*3/uL (ref 0.0–0.1)
Basophils Relative: 0 %
EOS PCT: 0 %
Eosinophils Absolute: 0 10*3/uL (ref 0.0–0.7)
HEMATOCRIT: 35 % — AB (ref 39.0–52.0)
Hemoglobin: 10.7 g/dL — ABNORMAL LOW (ref 13.0–17.0)
LYMPHS ABS: 1.4 10*3/uL (ref 0.7–4.0)
LYMPHS PCT: 9 %
MCH: 28.5 pg (ref 26.0–34.0)
MCHC: 30.6 g/dL (ref 30.0–36.0)
MCV: 93.1 fL (ref 78.0–100.0)
MONO ABS: 0.9 10*3/uL (ref 0.1–1.0)
MONOS PCT: 6 %
NEUTROS ABS: 12.4 10*3/uL — AB (ref 1.7–7.7)
Neutrophils Relative %: 85 %
PLATELETS: 154 10*3/uL (ref 150–400)
RBC: 3.76 MIL/uL — ABNORMAL LOW (ref 4.22–5.81)
RDW: 17 % — AB (ref 11.5–15.5)
WBC: 14.6 10*3/uL — ABNORMAL HIGH (ref 4.0–10.5)

## 2017-09-08 LAB — GLUCOSE, CAPILLARY
GLUCOSE-CAPILLARY: 239 mg/dL — AB (ref 65–99)
GLUCOSE-CAPILLARY: 282 mg/dL — AB (ref 65–99)
Glucose-Capillary: 211 mg/dL — ABNORMAL HIGH (ref 65–99)
Glucose-Capillary: 162 mg/dL — ABNORMAL HIGH (ref 65–99)
Glucose-Capillary: 183 mg/dL — ABNORMAL HIGH (ref 65–99)
Glucose-Capillary: 194 mg/dL — ABNORMAL HIGH (ref 65–99)

## 2017-09-08 LAB — MAGNESIUM: Magnesium: 2.8 mg/dL — ABNORMAL HIGH (ref 1.7–2.4)

## 2017-09-08 LAB — PHOSPHORUS: PHOSPHORUS: 3.9 mg/dL (ref 2.5–4.6)

## 2017-09-08 MED ORDER — DEXTROSE 5 % IV SOLN
INTRAVENOUS | Status: AC
  Administered 2017-09-08 (×2): via INTRAVENOUS

## 2017-09-08 MED ORDER — PHENOL 1.4 % MT LIQD
1.0000 | OROMUCOSAL | Status: DC | PRN
  Filled 2017-09-08: qty 177

## 2017-09-08 MED ORDER — RACEPINEPHRINE HCL 2.25 % IN NEBU
INHALATION_SOLUTION | RESPIRATORY_TRACT | Status: AC
  Administered 2017-09-08: 0.5 mL via RESPIRATORY_TRACT
  Filled 2017-09-08: qty 0.5

## 2017-09-08 MED ORDER — IPRATROPIUM-ALBUTEROL 0.5-2.5 (3) MG/3ML IN SOLN: 3.0000 mL | Freq: Four times a day (QID) | RESPIRATORY_TRACT | Status: DC

## 2017-09-08 MED ORDER — FREE WATER
300.0000 mL | Status: DC
  Administered 2017-09-08 (×2): 300 mL

## 2017-09-08 MED ORDER — OSMOLITE 1.2 CAL PO LIQD
1000.0000 mL | ORAL | Status: DC
  Administered 2017-09-08: 1000 mL

## 2017-09-08 MED ORDER — RACEPINEPHRINE HCL 2.25 % IN NEBU
0.5000 mL | INHALATION_SOLUTION | Freq: Once | RESPIRATORY_TRACT | Status: AC
  Administered 2017-09-08: 0.5 mL via RESPIRATORY_TRACT

## 2017-09-08 MED ORDER — IPRATROPIUM-ALBUTEROL 0.5-2.5 (3) MG/3ML IN SOLN: 3.0000 mL | Freq: Four times a day (QID) | RESPIRATORY_TRACT | Status: DC | PRN

## 2017-09-08 MED ORDER — FREE WATER: 200.0000 mL | Status: DC

## 2017-09-08 MED ORDER — PRO-STAT SUGAR FREE PO LIQD
30.0000 mL | Freq: Three times a day (TID) | ORAL | Status: DC
  Administered 2017-09-08: 30 mL
  Filled 2017-09-08: qty 30

## 2017-09-08 NOTE — Progress Notes (Signed)
PULMONARY / CRITICAL CARE MEDICINE   Name: Henry [name] MRN: [id number] DOB: 1935/01/17    ADMISSION DATE:  09/12/2017 CONSULTATION DATE:  08/31/2017  REFERRING MD:  Dyann Kief  CHIEF COMPLAINT:  dyspnea  HISTORY OF PRESENT ILLNESS:   83 y/o male with a recently diagnosed lung mass presented to the hospital with dyspnea.  He was intubated, self extubated, remained short of breath.   PAST MEDICAL HISTORY :  He  has a past medical history of Anemia, Anisocoria, Diabetes mellitus, GERD (gastroesophageal reflux disease), Hyperlipidemia, Other abnormal glucose, and Vitamin D deficiency.   REVIEW OF SYSTEMS:   As above  SUBJECTIVE:  Thirsty Wants tube out  VITAL SIGNS: BP 113/68 (BP Location: Left Arm)   Pulse 86   Temp 98.6 F (37 C) (Oral)   Resp 17   Ht 5\' 6"  (1.676 m)   Wt 83.8 kg (184 lb 11.9 oz)   SpO2 97%   BMI 29.82 kg/m   HEMODYNAMICS:    VENTILATOR SETTINGS: Vent Mode: PRVC FiO2 (%):  [40 %] 40 % Set Rate:  [14 bmp] 14 bmp Vt Set:  [510 mL] 510 mL PEEP:  [5 cmH20] 5 cmH20 Plateau Pressure:  [16 cmH20-19 cmH20] 17 cmH20  INTAKE / OUTPUT: I/O last 3 completed shifts: In: 3354.3 [I.V.:1254.3; Other:90; NG/GT:2010] Out: 5427 [Urine:3550; Stool:500]  PHYSICAL EXAMINATION:  General:  In bed on vent HENT: NCAT ETT in place PULM: CTA B, vent supported breathing CV: RRR, no mgr GI: BS+, soft, nontender MSK: normal bulk and tone Neuro: sedated on vent      LABS:  BMET Recent Labs  Lab 09/07/17 0854 09/07/17 1630 09/08/17 0500  NA 156* 158* 160*  K 3.6 3.4* 3.8  CL 128* 129* >130*  CO2 22 23 23   BUN 51* 56* 56*  CREATININE 0.93 1.04 1.16  GLUCOSE 210* 212* 219*    Electrolytes Recent Labs  Lab 09/06/17 1548 09/07/17 0407 09/07/17 0854 09/07/17 1630 09/08/17 0500  CALCIUM  --  8.9 8.8* 8.9 9.3  MG 2.4 2.6*  --   --  2.8*  PHOS 4.1 3.8  --   --  3.9    CBC Recent Labs  Lab 09/06/17 1143 09/07/17 0407 09/08/17 0500  WBC 20.2*  18.2* 14.6*  HGB 10.6* 10.6* 10.7*  HCT 33.7* 34.1* 35.0*  PLT 224 196 154    Coag's No results for input(s): APTT, INR in the last 168 hours.  Sepsis Markers Recent Labs  Lab 09/02/17 0332  PROCALCITON 0.11    ABG Recent Labs  Lab 09/02/17 1351 09/07/2017 0945  PHART 7.272* 7.358  PCO2ART 54.7* 37.9  PO2ART 112* 68.0*    Liver Enzymes No results for input(s): AST, ALT, ALKPHOS, BILITOT, ALBUMIN in the last 168 hours.  Cardiac Enzymes Recent Labs  Lab 09/06/17 0353 09/06/17 0929 09/06/17 1548  TROPONINI <0.03 <0.03 <0.03    Glucose Recent Labs  Lab 09/07/17 1233 09/07/17 1524 09/07/17 1929 09/07/17 2319 09/08/17 0306 09/08/17 0727  GLUCAP 186* 148* 216* 192* 211* 162*    Imaging Dg Chest Port 1 View  Result Date: 09/08/2017 CLINICAL DATA:  Respiratory failure with hypoxemia. Endotracheal tube position EXAM: PORTABLE CHEST 1 VIEW COMPARISON:  09/07/2017 FINDINGS: Endotracheal tube in good position. NG in the stomach. Right arm PICC tip in the SVC Right basilar lung mass and right hilar adenopathy unchanged. Mild bibasilar atelectasis. Negative for heart failure or effusion IMPRESSION: Endotracheal tube in good position.  Right lung mass unchanged. Electronically Signed  By: Franchot Gallo M.D.   On: 09/08/2017 07:05    SIGNIFICANT EVENTS: 09/25/2017 - admit  Been to [facility] on Sep 24, 2017 for shortness of breath. CT of the chest right lower lobe mass and small pleural effusion. Airway compression 08/31/17 - intubated 09/02/17 self extubated and reintubated 1/6 self extubated 1/7 EBUS > extensive airway tumor in right mainstem, RUL, also a mass seen in the LUL; needle aspiration of the station 7 lymph node showed necrotic tissue, station 4R lymph node showed high grade carcinoma   CULTURE: 1/1 Blood>  1/2 RSV >   ANTIBIOTICS: 1/1 Cefepime> 1/8 1/1 Vanc x1 1/1 Augmentin> 1/2  LINES/TUBES: 1/2 ETT> 1/4 (self ext, reint), 1/4 > 1/6 (self ext), 1/7  > 1/3 PICC >   DISCUSSION: 83 y/o male with a recently diagnosed lung mass causing airway compression and acute respiratory failure with hypoxemia.  He is a life long non-smoker.   ASSESSMENT / PLAN:  PULMONARY A: Acute respiratory failure with hypoxemia Large Right Lower Lung mass with bulky mediastinal lymphadenopathy Extensive airway burden from carcinoma, particularly in the R lung > does well with positive pressure as this is a pneumatic stent for the R mainstem P:   Plan to treat with XRT one week then reassess ability to extubate OK to continue daily SBT, but he needs a T-piece trial prior to extubation VAP preventsion  CARDIOVASCULAR A:  Sinus tachycardia > resolved P:  Tele Monitor hemodynamics  RENAL A:   Hypernatremia> worse despite free water via tube P:   Add D5W Stop D5 1/2 NS Increase Free water via tube Consider MRI brain (DI?) Repeat BMET 1/10 1900, if Na dropping more than 0.5 mEq/hr then hold D5W Monitor BMET and UOP Replace electrolytes as needed  GASTROINTESTINAL A:   GERD P:   Continue PPI Continue Tube feeding  HEMATOLOGIC A:   Mild anemia, normocytic P:  Monitor for bleeding  INFECTIOUS A:   Post obstructive pneumonia > treated with IV antibiotics P:   Monitor off of antibiotics  ENDOCRINE A:   Hyperglycemia on steroids  P:   Continue SSI and accuchecks  NEUROLOGIC A:   Anxiety, worse P:   RASS goal: -1 to -2 Continue precedex, increase fentanyl to infusion to help meet RASS goals Continue PRN versed  FAMILY  - Updates: Updated family at length on 1/10.   - Inter-disciplinary family meet or Palliative Care meeting due by:  day 7  My cc time 33 minutes  Roselie Awkward, MD [place] [facility] Pager: [phone] Cell: [phone] After 3pm or if no response, call [phone]   09/08/2017, 10:39 AM

## 2017-09-08 NOTE — Progress Notes (Signed)
Henry [name] started radiation treatments.  So far is tolerated this pretty well.  He seems to be fairly alert this morning.  He has marked electrolyte abnormalities.  He has marked hypernatremia and hyperchloremia.  I am unsure if this is from his tube feeds.  I spoke to the pathologist yesterday.  He says this is a very high-grade malignancy.  He is not sure this is even lung cancer.  Unfortunately, there is limited specimen for him to do additional studies.  Given that Henry [name] never smoked, I would think that squamous cell carcinoma will be a little unusual.  I have been trying to get with the family about what is going on.  Hopefully, I can meet with them this evening.  I will have to seriously consider adding some chemotherapy to the radiation to see if we cannot get a more brisk response.  Again, mitomycin-C, which is a "oldie but goodie" is very good at being a radiation sensitizer.  It might be hard to get since not many places still use this drug.  However, with him being in the ICU, this might be reasonable to try.  I do worry about his electrolyte abnormalities.  Given his elevated blood sugar, his sodium is actually higher than 160.  This, I suspect, is going to be a very difficult cancer to treat.  That this definitely is not curable.  I would think that our goal really needs to be his quality of life and get him off the vent.  Lattie Haw, MD  Henry [name] 1:5

## 2017-09-08 NOTE — Progress Notes (Signed)
Patient expressed discomfort, RN went to draw up Versed. RN alerted by ventilator alarms, patient had pulled his NG and ETT out  despite mittens and verbal requests. pateint immediately verbalized he removed tube because he was done with it, he stated he understood that not replacing the tube could potentially mean a poor outcome and potentially death and patient continued to state he did not want the tube put back in no matter what. MD aware, respiratory and rapid response RNs at bedside. Patietns family called and they are on the way in. Wife POA arriving shortly. Patient vitals and mentation stable RN to continue to monitor. MD at bedside discussing goals of care with patient.

## 2017-09-08 NOTE — Progress Notes (Signed)
Precedex at 0.110mcg for increased anxiety.

## 2017-09-08 NOTE — Progress Notes (Signed)
Nutrition Follow-up  DOCUMENTATION CODES:   Not applicable  INTERVENTION:  - Will adjust TF regimen: Osmolite 1.2 @ 55 mL/hr with 30 mL Prostat TID. This regimen will provide 1884 kcal, 118 grams of protein, and 1082 mL free water.  - Free water flush to be per MD/NP given severe hypernatremia and hyperchloridemia.   NUTRITION DIAGNOSIS:   Increased nutrient needs related to cancer and cancer related treatments, catabolic illness as evidenced by estimated needs. -ongoing  GOAL:   Patient will meet greater than or equal to 90% of their needs -met with TF regimen  MONITOR:   Vent status, TF tolerance, Weight trends, Labs  ASSESSMENT:   Pt with PMH of DM, GERD, HLD, HTN, and newly diagnosed lung cancer presents with chest pain and SOB found acute respiratory failure with hypoxia   Events: 1/1: admitted for SOB. CT scan revealed lung mass and pleural effusion. 1/2:placed on BiPAP without any improvement, pt intubated to protect airway, bronchoscopy shows massive subcarinal mass. 1/4: self extubated, then was reintubated. TF was resumed: Vital HP @ 72m/hr. 1/5: self extubated ~2214 per nursing notes 1/7: re-intubated and bronch done 1/8 or 1/9: dx with stage 4 squamous cell carcinoma of R lung 1/10: radiation started   1/10 Pt remains intubated with NGT in place. He is receiving Vital AF 1.2 @ 55 mL/hr with 30 mL Prostat once/day. This regimen is providing 1684 kcal (86% re-estimated kcal need), 114 grams of protein, and 1065 mL free water. Order also in place for 300 mL free water every 4 hours (1800 mL/day). Weight has been stable.   Pt has newly dx Stage 4 squamous cell carcinoma of R lung and had radiation this AM.   Patient is currently intubated on ventilator support MV: 10.1 L/min Temp (24hrs), Avg:98.8 F (37.1 C), Min:97.9 F (36.6 C), Max:99.9 F (37.7 C) BP: 92/48 and MAP: 61  Medications reviewed; 20 mg IV Pepcid BID, sliding scale Novolog, 40 mg Solu-medrol  BID, 40 mg Protonix per NGT/day.  Labs reviewed; CBGs: 211, 162, and 183 mg/dL today, Na: 160 mmol/L, Cl: >130 mmol/L, BUN: 56 mg/dL, GFR: 58 mL/min.   IVF: D5 @ 75 mL/hr (306 kcal) Drip: Precedex @ 1.2 mcg/kg/hr.      1/7 - NGT in place to L nare and abd x-ray reading stated tip of tube is in the stomach. - Estimated nutrition needs updated.  - Weight trending down over the past few days; will continue to monitor trend and make adjustments as warranted.   Patient is currently intubated on ventilator support MV: 8 L/min Temp (24hrs), Avg:98.2 F (36.8 C), Min:97.9 F (36.6 C), Max:98.6 F (37 C) Propofol: none BP: 110/52 and MAP: 70   Medications reviewed; 20 mg IV Pepcid BID, sliding scale Novolog, 60 mg Solu-medrol BID, 40 mg Protonix/day.  Labs reviewed; CBGs: 167 and 184 mg/dL this AM, Cl: 115 mmol/L, BUN: 45 mg/dL, Phos: 4.8 mg/dL.  IVF: D5-1/2 NS @ 10 mL/hr (41 kcal). Drip: Precedex @ 0.6 mcg/kg/hr.    1/6 - Pt currently NPO and not intubated.  - Recommendations for TF if pt is to be re-intubated: resume Vital HP @ 50 ml/hr. - Provides 1200kcals,105grams protein, 10021mfree water. - Recommend resuming Ensure supplements if diet is advanced     Diet Order:  Diet NPO time specified  EDUCATION NEEDS:   No education needs have been identified at this time  Skin:  Skin Assessment: Reviewed RN Assessment  Last BM:  1/10  Height:  Ht Readings from Last 1 Encounters:  09/08/2017 5' 6"  (1.676 m)    Weight:   Wt Readings from Last 1 Encounters:  09/08/17 184 lb 11.9 oz (83.8 kg)    Ideal Body Weight:  64.5 kg  BMI:  Body mass index is 29.82 kg/m.  Estimated Nutritional Needs:   Kcal:  4174  Protein:  100-124 grams (1.2-1.5 grams/kg)  Fluid:  1.8L/day       Jarome Matin, MS, RD, LDN, CNSC Inpatient Clinical Dietitian Pager # [phone] After hours/weekend pager # [phone]

## 2017-09-08 NOTE — Progress Notes (Signed)
Called to emergently come evaluate patient who just self-extubated. On my exam Pox 98% on NRB, RR 16-20. Mild wheezing b/l and Moderate Stridor. Patient is AAOx3 and capable of making his own decisions. He says he wants to be DNR/DNI. He understands why he is in the hospital and that there's a chance his breathing could worsen leading to his death. He says in that case just try to make him comfortable. Will order duonebs. Changed code status to reflect DNR/DNI. RN has called patient's family.

## 2017-09-09 ENCOUNTER — Inpatient Hospital Stay (HOSPITAL_COMMUNITY): Payer: Medicare Other

## 2017-09-09 ENCOUNTER — Ambulatory Visit: Payer: Medicare Other

## 2017-09-09 ENCOUNTER — Encounter: Payer: Self-pay | Admitting: Radiation Oncology

## 2017-09-09 DIAGNOSIS — E119 Type 2 diabetes mellitus without complications: Secondary | ICD-10-CM

## 2017-09-09 LAB — CBC WITH DIFFERENTIAL/PLATELET
BASOS ABS: 0 10*3/uL (ref 0.0–0.1)
BASOS PCT: 0 %
EOS ABS: 0 10*3/uL (ref 0.0–0.7)
Eosinophils Relative: 0 %
HCT: 39.2 % (ref 39.0–52.0)
Hemoglobin: 12.2 g/dL — ABNORMAL LOW (ref 13.0–17.0)
Lymphocytes Relative: 3 %
Lymphs Abs: 0.7 10*3/uL (ref 0.7–4.0)
MCH: 29 pg (ref 26.0–34.0)
MCHC: 31.1 g/dL (ref 30.0–36.0)
MCV: 93.3 fL (ref 78.0–100.0)
MONO ABS: 2.4 10*3/uL — AB (ref 0.1–1.0)
Monocytes Relative: 10 %
NEUTROS PCT: 87 %
Neutro Abs: 20.8 10*3/uL — ABNORMAL HIGH (ref 1.7–7.7)
PLATELETS: 135 10*3/uL — AB (ref 150–400)
RBC: 4.2 MIL/uL — ABNORMAL LOW (ref 4.22–5.81)
RDW: 17.4 % — ABNORMAL HIGH (ref 11.5–15.5)
WBC MORPHOLOGY: INCREASED
WBC: 23.9 10*3/uL — AB (ref 4.0–10.5)

## 2017-09-09 LAB — BASIC METABOLIC PANEL
BUN: 55 mg/dL — ABNORMAL HIGH (ref 6–20)
CALCIUM: 9.6 mg/dL (ref 8.9–10.3)
CO2: 24 mmol/L (ref 22–32)
CREATININE: 1.22 mg/dL (ref 0.61–1.24)
Chloride: >130 mmol/L (ref 101–111)
GFR calc Af Amer: >60 mL/min (ref >60)
GFR, EST NON AFRICAN AMERICAN: 54 mL/min — AB (ref >60)
Glucose, Bld: 182 mg/dL — ABNORMAL HIGH (ref 65–99)
Potassium: 3.9 mmol/L (ref 3.5–5.1)
SODIUM: 159 mmol/L — AB (ref 135–145)

## 2017-09-09 LAB — GLUCOSE, CAPILLARY
GLUCOSE-CAPILLARY: 149 mg/dL — AB (ref 65–99)
Glucose-Capillary: 161 mg/dL — ABNORMAL HIGH (ref 65–99)

## 2017-09-09 MED ORDER — MORPHINE SULFATE (PF) 4 MG/ML IV SOLN
INTRAVENOUS | Status: AC
  Filled 2017-09-09: qty 1

## 2017-09-09 MED ORDER — MORPHINE SULFATE (PF) 4 MG/ML IV SOLN
1.0000 mg | INTRAVENOUS | Status: DC | PRN
Start: 2017-09-09 — End: 2017-09-09
  Administered 2017-09-09: 2 mg via INTRAVENOUS
  Filled 2017-09-09: qty 1

## 2017-09-09 MED ORDER — MORPHINE BOLUS VIA INFUSION
5.0000 mg | INTRAVENOUS | Status: DC | PRN
  Filled 2017-09-09: qty 20

## 2017-09-09 MED ORDER — SODIUM CHLORIDE 0.9 % IV SOLN
10.0000 mg/h | INTRAVENOUS | Status: DC
  Administered 2017-09-09: 10 mg/h via INTRAVENOUS
  Filled 2017-09-09: qty 10

## 2017-09-09 MED ORDER — MORPHINE SULFATE (PF) 4 MG/ML IV SOLN: 1.0000 mg | INTRAVENOUS | Status: DC | PRN

## 2017-09-09 MED ORDER — MORPHINE SULFATE (PF) 4 MG/ML IV SOLN
1.0000 mg | Freq: Once | INTRAVENOUS | Status: AC
  Administered 2017-09-09: 1 mg via INTRAVENOUS

## 2017-09-12 ENCOUNTER — Ambulatory Visit: Payer: Medicare Other

## 2017-09-12 ENCOUNTER — Telehealth: Payer: Self-pay

## 2017-09-12 NOTE — Telephone Encounter (Signed)
On 09/12/17 I received a d/c from [facility] (original). The d/c is for cremation. The patient is a patient of Doctor McQuaid. The d/c will be taken to Pulmonary Unit @ [facility] for signature.  On 09/13/17 I received the d/c back from Doctor McQuaid. I got the d/c ready and called the funeral home to let them know the d/c is ready for pickup. I also faxed a copy to the funeral home per the funeral home request.

## 2017-09-13 ENCOUNTER — Ambulatory Visit: Payer: Medicare Other

## 2017-09-14 ENCOUNTER — Ambulatory Visit: Payer: Medicare Other | Admitting: Pulmonary Disease

## 2017-09-14 ENCOUNTER — Ambulatory Visit: Payer: Medicare Other

## 2017-09-15 ENCOUNTER — Encounter (HOSPITAL_COMMUNITY): Payer: Self-pay

## 2017-09-15 ENCOUNTER — Ambulatory Visit: Payer: Medicare Other

## 2017-09-16 ENCOUNTER — Ambulatory Visit: Payer: Medicare Other

## 2017-09-17 ENCOUNTER — Other Ambulatory Visit: Payer: Self-pay | Admitting: Nurse Practitioner

## 2017-09-19 ENCOUNTER — Ambulatory Visit: Payer: Medicare Other

## 2017-09-20 ENCOUNTER — Ambulatory Visit: Payer: Medicare Other

## 2017-09-21 ENCOUNTER — Ambulatory Visit: Payer: Medicare Other

## 2017-09-22 ENCOUNTER — Ambulatory Visit: Payer: Medicare Other

## 2017-09-23 ENCOUNTER — Ambulatory Visit: Payer: Medicare Other

## 2017-09-26 ENCOUNTER — Ambulatory Visit: Payer: Medicare Other

## 2017-09-27 ENCOUNTER — Ambulatory Visit: Payer: Medicare Other

## 2017-09-28 ENCOUNTER — Ambulatory Visit: Payer: Medicare Other

## 2017-09-29 ENCOUNTER — Ambulatory Visit: Payer: Medicare Other

## 2017-09-30 ENCOUNTER — Ambulatory Visit: Payer: Medicare Other

## 2017-09-30 NOTE — Progress Notes (Signed)
Trudee Kuster, RT on L3 reports the patient is refusing radiation therapy today. Informed Ashlyn Bruning, PA-C and Dr. Tammi Klippel of these findings. Also, made them aware the patient status changed to DRN, he self extubated last night and refuses to be re-intubated.

## 2017-09-30 NOTE — Progress Notes (Signed)
  Radiation Oncology         [phone] ________________________________  Name: Henry [name] MRN: [id number]  Date: 09/20/2017  DOB: [birth year]  INPATIENT  SIMULATION AND TREATMENT PLANNING NOTE    ICD-10-CM   1. Primary cancer of right lower lobe of lung (HCC) C34.31     DIAGNOSIS:  83 yo man with presumed right lower lung cancer pending biopsy result, with central airway compromise on a ventilator in need of emergency radiotherapy  NARRATIVE:  The patient was brought to the [facility].  Identity was confirmed.  All relevant records and images related to the planned course of therapy were reviewed.  The patient freely provided informed written consent to proceed with treatment after reviewing the details related to the planned course of therapy. The consent form was witnessed and verified by the simulation staff.  Then, the patient was set-up in a stable reproducible  supine position for radiation therapy.  CT images were obtained.  Surface markings were placed.  The CT images were loaded into the planning software.  Then the target and avoidance structures were contoured.  Treatment planning then occurred.  The radiation prescription was entered and confirmed.  Then, I designed and supervised the construction of a total of 2 medically necessary complex treatment devices.  I have requested : 3D Simulation  I have requested a DVH of the following structures: left lung, right lung, spinal cord and targets.    PLAN:  The patient will receive 30 Gy in 10 fractions and possible boost.  ________________________________  Sheral Apley. Tammi Klippel, M.D.

## 2017-09-30 NOTE — Progress Notes (Signed)
Henry [name] self extubated him himself last night.  He has a facemask oxygen on.  I spoke to his wife and 2 children last night.  I spent about 45 minutes with him.  From my perspective, I would consider him as having stage IV disease.  Technically, he has stage IIIb but I would feel that with the extent of his thoracic disease, along with the sternal metastasis, this probably would behave more like stage IV.  Given this, I told him that our goal of care would be to try to improve his quality of life so that he could breathe better and be more functional.  I do not believe that this is a malignancy that can be cured.  I told him that this is a very aggressive cancer.  I spoke with Dr. Saralyn Pilar of pathology.  He is not even sure this is a primary lung cancer.  We are trying to do some additional testing on the biopsies.  He told me that he is not sure if there is enough material that was obtained for biopsies.  I would like to hope that there would be.  He is started radiation therapy.  I want to give him some low-dose concurrent chemotherapy to try to improve the response and see if we can get a more rapid response.  I think that the use of low-dose carboplatinum with a 5-FU infusion would be a good idea.  He could tolerate this.  I think toxicity would be minimal.  I explained this to his family.  I told him that the use of low-dose chemotherapy would hopefully improve the radiations affect.  I went over the toxicities of treatment with his family.  Again I just do not believe that he would have a lot of problems.  Given the low dose that we are using, I do not think that his blood counts would be affected.  I do not think he would have issues with mucositis, diarrhea, nausea/vomiting.  They agreed to giving the chemotherapy a try with radiation.  I also told him that I was quite worried about his electrolyte abnormalities.  His marked hypernatremia and hyperchloremia I think are troublesome.  I would  not be surprised if this is somehow related to his underlying malignancy.  Again, in talking with his family, they want her quality of life to be the primary goal.  I totally agree with them.  Since we are dealing with a malignancy that is not curable, our treatment recommendations need to be dependent upon his performance status and trying to improve his quality of life.  I answered all their questions.  They are very appreciative of the wonderful care that he is getting by the staff in the ICU.  Hopefully, he will stay extubated.  I am not sure if he would be all that cooperative for radiation therapy.  He was somewhat "excitable" this morning.  He does have a PICC line that would allow [place] to do infusional 5-FU.  Lattie Haw, MD  Darlyn Chamber 30:17

## 2017-09-30 NOTE — Progress Notes (Signed)
[facility]  Met with the patient's wife.    Plan now is for full comfort measures  Morphine has been helpful, will continue PRN dosing for now, will likely need drip  Roselie Awkward, MD [place] [facility] Pager: [phone] Cell: [phone] After 3pm or if no response, call [phone]

## 2017-09-30 NOTE — Progress Notes (Signed)
Nutrition Brief Note  Chart reviewed. Last RD assessment done yesterday.  Pt now transitioning to comfort care.  No further nutrition interventions warranted at this time.  Please re-consult as needed.     Jarome Matin, MS, RD, LDN, [facility] Inpatient Clinical Dietitian Pager # [phone] After hours/weekend pager # [phone]

## 2017-09-30 NOTE — Progress Notes (Signed)
PULMONARY / CRITICAL CARE MEDICINE   Name: Henry [name] MRN: [id number] DOB: 11/09/1934    ADMISSION DATE:  09/20/2017 CONSULTATION DATE:  08/31/2017  REFERRING MD:  Dyann Kief  CHIEF COMPLAINT:  dyspnea  HISTORY OF PRESENT ILLNESS:   83 y/o male with a recently diagnosed lung mass presented to the hospital with dyspnea.  He was intubated, self extubated, remained short of breath.   PAST MEDICAL HISTORY :  He  has a past medical history of Anemia, Anisocoria, Diabetes mellitus, GERD (gastroesophageal reflux disease), Hyperlipidemia, Other abnormal glucose, and Vitamin D deficiency.   REVIEW OF SYSTEMS:   As above  SUBJECTIVE:  Self extubated overnight, refused reintubation, CODE STATUS changed to DNR.  This morning he tells me that he is tired, he wants to take a break from all treatment.  VITAL SIGNS: BP (!) 189/102   Pulse (!) 108   Temp 98.2 F (36.8 C) (Oral)   Resp (!) 23   Ht _0  (1.676 m)   Wt 82.7 kg (182 lb 5.1 oz)   SpO2 98%   BMI 29.43 kg/m   HEMODYNAMICS:    VENTILATOR SETTINGS: Vent Mode: PRVC FiO2 (%):  [50 %-100 %] 100 % Set Rate:  [14 bmp] 14 bmp Vt Set:  [510 mL] 510 mL PEEP:  [5 cmH20] 5 cmH20 Plateau Pressure:  [16 cmH20-22 cmH20] 16 cmH20  INTAKE / OUTPUT: I/O last 3 completed shifts: In: 4036.4 [I.V.:2356.4; NG/GT:1430; IV Piggyback:250] Out: 4765 [Urine:3250; YYTKP:5465]  PHYSICAL EXAMINATION:  General:  Tachypnea, increased work of breathing, can't complete sentences HENT: NCAT NRB mask in place PULM: Loud upper airway rhonchi/crackles, increased respiratory effort CV: Tachy, regular, no mgr GI: BS infrequent, soft, nontender MSK: normal bulk and tone Neuro: Awake, alert, conversant   LABS:  BMET Recent Labs  Lab 09/08/17 0500 09/08/17 1731 09/27/17 0418  NA 160* 157* 159*  K 3.8 3.9 3.9  CL >130* >130* >130*  CO2 _1 BUN 56* 62* 55*  CREATININE 1.16 1.28* 1.22  GLUCOSE 219* 334* 182*    Electrolytes Recent  Labs  Lab 09/06/17 1548 09/07/17 0407  09/08/17 0500 09/08/17 1731 2017/09/27 0418  CALCIUM  --  8.9   < > 9.3 9.2 9.6  MG 2.4 2.6*  --  2.8*  --   --   PHOS 4.1 3.8  --  3.9  --   --    < > = values in this interval not displayed.    CBC Recent Labs  Lab 09/07/17 0407 09/08/17 0500 September 27, 2017 0418  WBC 18.2* 14.6* 23.9*  HGB 10.6* 10.7* 12.2*  HCT 34.1* 35.0* 39.2  PLT 196 154 135*    Coag's No results for input(s): APTT, INR in the last 168 hours.  Sepsis Markers No results for input(s): LATICACIDVEN, PROCALCITON, O2SATVEN in the last 168 hours.  ABG Recent Labs  Lab 09/02/17 1351 09/04/2017 0945  PHART 7.272* 7.358  PCO2ART 54.7* 37.9  PO2ART 112* 68.0*    Liver Enzymes No results for input(s): AST, ALT, ALKPHOS, BILITOT, ALBUMIN in the last 168 hours.  Cardiac Enzymes Recent Labs  Lab 09/06/17 0353 09/06/17 0929 09/06/17 1548  TROPONINI <0.03 <0.03 <0.03    Glucose Recent Labs  Lab 09/08/17 1144 09/08/17 1550 09/08/17 1926 09/08/17 2303 2017/09/27 0326 September 27, 2017 0734  GLUCAP 183* 239* 282* 194* 149* 161*    Imaging Dg Chest Port 1 View  Result Date: 09/27/17 CLINICAL DATA:  Respiratory failure EXAM: PORTABLE CHEST 1 VIEW COMPARISON:  September 08, 2017 FINDINGS: Endotracheal tube and nasogastric tube have been removed. Central catheter tip is in the superior vena cava. No pneumothorax. The previously noted mass in the right lower lung zone with adjacent consolidation persists without change. Right hilar fullness is likewise stable. Left lung remains clear. No new opacity on either side. Heart is upper normal in size with pulmonary vascularity within normal limits. No bone lesions. IMPRESSION: No pneumothorax. Central catheter position unchanged. Right lower lobe mass with right hilar fullness persists with patchy surrounding pneumonitis. No new opacity. Stable cardiac silhouette. Electronically Signed   By: Lowella Grip III M.D.   On: October 02, 2017  07:02    SIGNIFICANT EVENTS: 09/02/2017 - [facility] on 08/2017 for shortness of breath. CT of the chest right lower lobe mass and small pleural effusion. Airway compression 08/31/17 - intubated 09/02/17 self extubated and reintubated 1/6 self extubated 1/7 EBUS > extensive airway tumor in right mainstem, RUL, also a mass seen in the LUL; needle aspiration of the station 7 lymph node showed necrotic tissue, station 4R lymph node showed high grade carcinoma 1/10 self extubated, changed code status to DNR   CULTURE: 1/1 Blood> negative 1/2 RSV > negative  ANTIBIOTICS: 1/1 Cefepime> 1/8 1/1 Vanc x1 1/1 Augmentin> 1/2  LINES/TUBES: 1/2 ETT> 1/4 (self ext, reint), 1/4 > 1/6 (self ext), 1/7 > 1/10 (self extubated) 1/3 PICC >   DISCUSSION: 83 y/o male with a recently diagnosed lung mass causing airway compression and acute respiratory failure with hypoxemia.  He is a life long non-smoker.  He has self extubated three times. See discussion at the end of my note.  ASSESSMENT / PLAN:  PULMONARY A: Acute respiratory failure with hypoxemia > acutely worse this morning after self extubation Large Right Lower Lung mass with bulky mediastinal lymphadenopathy Extensive airway burden from carcinoma, particularly in the R lung  P:   BIPAP PRN > patient refused DNR Continue solumedrol  CARDIOVASCULAR A:  Sinus tachycardia > resolved Hypertension P:  Tele Monitor hemodynamics  RENAL A:   Hypernatremia> worse despite free water via tube and D5, due to malignancy? P:   Increase D5W again Allow free water Monitor sodium closely  GASTROINTESTINAL A:   GERD P:   Continue PPI   HEMATOLOGIC A:   Mild anemia, normocytic P:  Monitor for bleeding  INFECTIOUS A:   Post obstructive pneumonia > treated with IV antibiotics P:   Monitor off of antibiotics  ENDOCRINE A:   Hyperglycemia on steroids  P:   Continue SSI and accuchecks  NEUROLOGIC A:   Anxiety,  worse P:   Prn ativan  FAMILY  - Updates: I met with the patient and his son on 1/11.  The patient asked that he be allowed to rest until Monday before further treatments.  I advised that I did not think he would survive until then without support and radiation therapy.  He was willing to try BIPAP, but then ultimately refused that.  Our tentative plan at that point had been to perform XRT tomorrow (he refused to have it today) on BIPAP.  He again refused BIPAP and said, "I'm done".  I asked if he wanted to be comfortable and he said yes. He requested water.  The son is currently asking for the patient's wife to come to the hospital as well as his daughter.  I will give a low dose of morphine 45m for temporary relief while we wait for his family.  Then we will  start full comfort measures.  Discussed with Dr. Marin Olp  - Inter-disciplinary family meet or Palliative Care meeting due by:  day 7  My cc time 45 minutes  Roselie Awkward, MD [place] [facility] Pager: [phone] Cell: [phone] After 3pm or if no response, call [phone]   09-13-2017, 8:29 AM

## 2017-09-30 NOTE — Death Summary Note (Signed)
DEATH SUMMARY   Patient Details  Name: Henry [name] MRN: [id number] DOB: [birth year]  Admission/Discharge Information   Admit Date:  2017/09/06  Date of Death: Date of Death: 09-16-2017  Time of Death: Time of Death: 11/15/54  Length of Stay: 11-14-22  Referring Physician: Hoyt Koch, MD   Reason(s) for Hospitalization  Respiratory failure  Diagnoses  Preliminary cause of death:   Non-small cell lung cancer Secondary Diagnoses (including complications and co-morbidities):  Principal Problem:   Acute respiratory failure with hypoxemia ([place]) Active Problems:   Diet-controlled diabetes mellitus ([place])   Hyperlipidemia   GERD   Essential hypertension   Right lower lobe lung mass   Primary cancer of right lower lobe of lung ([place])   Acute on chronic respiratory failure with hypoxia ([place])   Mediastinal adenopathy   Palliative care encounter   Hypernatremia   Brief Hospital Course (including significant findings, care, treatment, and services provided and events leading to death)  Henry [name] is a 83 y.o. year old male who is a lifelong non-smoker and found in December 2018 to have evidence of a right lower lobe mass.  He was being followed by pulmonary for this and plans were made for a biopsy.  However he presented to our facility with increasing shortness of breath.  He was found to be in acute respiratory failure with hypoxemia and required intubation and mechanical ventilation.  He self extubated several times.  He required a bronchoscopy with biopsy which showed significant endobronchial tumor burden.  Biopsies were consistent with non-small cell lung cancer.  The patient was treated with radiation therapy while on the ventilator with hopes of improvement in oxygenation and ventilation.  He self extubated.  After self extubation he made it clear that he did not want to be reintubated.  Of note, this is the third self extubation.  Extensive conversations were held with the  patient's family.  Oncology was also involved.  Patient made it clear that he wanted to be made comfortable.  He was treated with morphine per his request and died peacefully with his family at the bedside in the medical intensive care unit.    Pertinent Labs and Studies  Significant Diagnostic Studies Dg Chest 2 View  Result Date: September 06, 2017 CLINICAL DATA:  Lung cancer. EXAM: CHEST  2 VIEW COMPARISON:  04/04/2015 FINDINGS: Two-view chest shows right lower lobe pulmonary mass lesion with diffuse right middle lobe opacity and small right pleural effusion. The cardio pericardial silhouette is enlarged. Left lower lobe granuloma stable in the interval. There is pulmonary vascular congestion without overt pulmonary edema. The visualized bony structures of the thorax are intact. Telemetry leads overlie the chest. IMPRESSION: Right lower lobe pulmonary mass with probable right middle lobe atelectasis and small right pleural effusion. Imaging features compatible with the reported clinical history of lung cancer. Electronically Signed   By: Misty Stanley M.D.   On: 2017-09-06 11:02   Dg Abd 1 View  Result Date: 09/25/2017 CLINICAL DATA:  83 year old male with a history of nasogastric tube placement EXAM: ABDOMEN - 1 VIEW COMPARISON:  08/31/2017 FINDINGS: Limited images of upper abdomen. Nasogastric tube terminates in the left upper quadrant. The side port of the nasogastric tube appears beyond the region of the GE junction, below the diaphragms. Gas within stomach and colon. IMPRESSION: Limited plain film abdomen demonstrates nasogastric tube terminating in the stomach, with the side-port appearing to be beyond the region of the GE junction. Electronically Signed   By: Corrie Mckusick  D.O.   On: 09/18/2017 10:50   Dg Abd 1 View  Result Date: 08/31/2017 CLINICAL DATA:  OG tube placement. EXAM: ABDOMEN - 1 VIEW COMPARISON:  None. FINDINGS: OG tube appears adequately positioned in the stomach. Proximal side holes  are approximately 5 cm below the gastroesophageal junction. Visualized bowel gas pattern is nonobstructive. IMPRESSION: Enteric tube appears adequately positioned in the stomach. Electronically Signed   By: Franki Cabot M.D.   On: 08/31/2017 19:14   Ct Angio Chest Pe W And/or Wo Contrast  Result Date: 09/21/2017 CLINICAL DATA:  Lung cancer.  Short of breath. EXAM: CT ANGIOGRAPHY CHEST WITH CONTRAST TECHNIQUE: Multidetector CT imaging of the chest was performed using the standard protocol during bolus administration of intravenous contrast. Multiplanar CT image reconstructions and MIPs were obtained to evaluate the vascular anatomy. CONTRAST:  129mL ISOVUE-370 IOPAMIDOL (ISOVUE-370) INJECTION 76% COMPARISON:  07/31/2010 FINDINGS: Cardiovascular: There are no filling defects in the pulmonary arterial tree to suggest acute pulmonary thromboembolism. Atherosclerotic calcifications of the aortic arch are present. No evidence of aortic dissection or aneurysm. Great vessels are grossly patent. There is narrowing of several of the central right pulmonary arteries and veins secondary to malignancy. Mild 3 vessel coronary artery calcification. Mediastinum/Nodes: There is abnormal confluence soft tissue mass throughout the mediastinum worrisome for malignancy. 5.5 x 6.7 cm subcarinal mass extending into the precarinal and right hilar regions. This is also continuous with a 3.3 x 2.7 cm right peritracheal mass. 2.0 cm prevascular lymph node on image 27. 1.6 cm high right paratracheal lymph node. Other smaller scattered lymph nodes are noted. No pericardial effusion. Visualized thyroid is unremarkable. The esophagus extends through the subcarinal mass and becomes imperceptible. Malignant involvement of the esophagus cannot be excluded. See image 42 of series 4 abnormal soft tissue extends into the left hilar region. See image 42 of series 4 Lungs/Pleura: 4.9 x 3.6 cm mass in the superior segment right lower lobe is  associated with surrounding patchy and interstitial opacities. Findings are worrisome for malignancy and possible lymphangitic spread of tumor. There is a second smaller more central right lower lobe lung mass measuring 1.5 cm on image 95 of series 6. Left lung is clear other than calcified granulomata and subsegmental atelectasis. There is an associated small right pleural effusion. Confluent mass extending from the mediastinum into the right hilum causes severe narrowing of the right mainstem bronchus and right lung lobar airways. The right middle lobe airway is occluded and there is collapse of the right middle lobe. Upper Abdomen: No acute abnormality. Musculoskeletal: There is a aggressive destructive lesion in the manubrium worrisome for metastatic skeletal involvement of malignancy. T12 mA angioma has a benign appearance. Review of the MIP images confirms the above findings. IMPRESSION: No evidence of pulmonary thromboembolism. Right lower lobe lung mass associated with mediastinal and bilateral hilar adenopathy. Mediastinal adenopathy is confluent, extends in of the right hilum, and causes narrowing of airways as well as pulmonary vessel branches. Right pleural effusion. Right middle lobe collapse secondary to right middle lobe airway occlusion. Possible lymphangitic tumor in the right lower lobe. Possible involvement of the midesophagus. 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 Biopsy should provide diagnostic material. Aortic Atherosclerosis (ICD10-I70.0). Electronically Signed   By: Marybelle Killings M.D.   On: 09/16/2017 13:10   [place] Chest (pleural Effusion)  Result Date: 08/31/2017 CLINICAL DATA:  SMALL RIGHT PLEURAL EFFUSION EXAM: CHEST ULTRASOUND COMPARISON:  09/08/2017 FINDINGS: Ultrasound performed the right chest with  the patient right side up decubitus. Small right effusion noted. There is not enough fluid to warrant thoracentesis for therapeutic  purposes. Procedure not performed. IMPRESSION: Small right pleural effusion.  Thoracentesis not performed. Electronically Signed   By: Jerilynn Mages.  Shick M.D.   On: 08/31/2017 10:55   Dg Chest Port 1 View  Result Date: 09/24/17 CLINICAL DATA:  Respiratory failure EXAM: PORTABLE CHEST 1 VIEW COMPARISON:  September 08, 2017 FINDINGS: Endotracheal tube and nasogastric tube have been removed. Central catheter tip is in the superior vena cava. No pneumothorax. The previously noted mass in the right lower lung zone with adjacent consolidation persists without change. Right hilar fullness is likewise stable. Left lung remains clear. No new opacity on either side. Heart is upper normal in size with pulmonary vascularity within normal limits. No bone lesions. IMPRESSION: No pneumothorax. Central catheter position unchanged. Right lower lobe mass with right hilar fullness persists with patchy surrounding pneumonitis. No new opacity. Stable cardiac silhouette. Electronically Signed   By: Lowella Grip III M.D.   On: 2017/09/24 07:02   Dg Chest Port 1 View  Result Date: 09/08/2017 CLINICAL DATA:  Respiratory failure with hypoxemia. Endotracheal tube position EXAM: PORTABLE CHEST 1 VIEW COMPARISON:  09/07/2017 FINDINGS: Endotracheal tube in good position. NG in the stomach. Right arm PICC tip in the SVC Right basilar lung mass and right hilar adenopathy unchanged. Mild bibasilar atelectasis. Negative for heart failure or effusion IMPRESSION: Endotracheal tube in good position.  Right lung mass unchanged. Electronically Signed   By: Franchot Gallo M.D.   On: 09/08/2017 07:05   Dg Chest Port 1 View  Result Date: 09/07/2017 CLINICAL DATA:  Hypoxia EXAM: PORTABLE CHEST 1 VIEW COMPARISON:  September 06, 2017 chest radiograph and chest CT August 30, 2017 FINDINGS: Endotracheal tube tip is 2.6 cm above the carina. Nasogastric tube tip and side port below the diaphragm. Central catheter tip is in the superior vena cava, unchanged. No  pneumothorax evident. The known mass in the right lower lobe is again noted measuring approximately 5 x 4.5 cm. There is patchy consolidation in this area as well. There is bilateral hilar and mediastinal fullness consistent with adenopathy, better appreciated on recent CT examination. There is no edema or consolidation on the left. The heart is upper normal in size with pulmonary vascularity within normal limits. IMPRESSION: Tube and catheter positions as described without evident pneumothorax. Right lower lobe mass with extensive adenopathy again noted. Patchy consolidation in the right mid lung is larger and atelectasis in the region of the mass. A degree of pneumonia superimposed on this area cannot be excluded. There is also atelectasis in the right base. There is no edema or consolidation on the left. Stable cardiac silhouette. Electronically Signed   By: Lowella Grip III M.D.   On: 09/07/2017 07:09   Dg Chest Port 1 View  Result Date: 09/06/2017 CLINICAL DATA:  Respiratory failure. EXAM: PORTABLE CHEST 1 VIEW COMPARISON:  09/04/2017.  09/03/2017.  CT 09/21/2017. FINDINGS: Interim intubation, endotracheal tube tip 2.6 cm above the carina. Interim placement NG tube, its tip is below left hemidiaphragm. Right PICC line in stable position. Mediastinal fullness and hilar fullness again noted consistent known adenopathy. Right base mass again noted. Small right pleural effusion again noted. No pneumothorax. IMPRESSION: 1. Interim intubation, endotracheal tube tip is 2.6 cm above the carina. Interim placement of NG tube, its tip is below the left hemidiaphragm. Right PICC line in stable position. 2. Mediastinal and hilar fullness again noted  consistent with known adenopathy. Right lung base mass again noted. Small right pleural effusion again noted. These findings are unchanged. Electronically Signed   By: Marcello Moores  Register   On: 09/06/2017 07:18   Dg Chest Port 1 View  Result Date: 09/04/2017 CLINICAL  DATA:  Endotracheal tube and NG tube removal. EXAM: PORTABLE CHEST 1 VIEW COMPARISON:  09/03/2017 and prior exams FINDINGS: The cardiomediastinal silhouette is unchanged. Mediastinal and right hilar fullness and poorly visualized right lower lung mass again identified. The endotracheal tube and NG tube have been removed. A right PICC line with tip overlying the lower SVC again noted. No pneumothorax or large pleural effusion. No other significant change. IMPRESSION: Endotracheal tube and NG tube removal without other significant change. Electronically Signed   By: Margarette Canada M.D.   On: 09/04/2017 07:27   Dg Chest Port 1 View  Result Date: 09/03/2017 CLINICAL DATA:  Pneumonia, diabetes. EXAM: PORTABLE CHEST 1 VIEW COMPARISON:  09/02/2017. FINDINGS: Normal cardiac size. Tubes and lines appear stable. RIGHT perihilar opacity is unchanged. IMPRESSION: Stable exam. Electronically Signed   By: Staci Righter M.D.   On: 09/03/2017 07:31   Portable Chest Xray  Result Date: 09/02/2017 CLINICAL DATA:  Intubated, enteric tube placement EXAM: PORTABLE CHEST 1 VIEW COMPARISON:  Chest radiograph from one day prior. FINDINGS: Endotracheal tube tip is 3.7 cm above the carina. Enteric tube enters stomach with the tip not seen on this image. Right PICC terminates at the cavoatrial junction. Stable cardiomediastinal silhouette with normal heart size and enlargement of the lower right hilum. The lower trachea and mainstem bronchi appeared diminutive, unchanged. No pneumothorax. Trace bilateral pleural effusions, stable. Stable right lower lobe lung mass and right middle lobe atelectasis. Stable left lung base atelectasis. No pulmonary edema. IMPRESSION: 1. Well-positioned lines and tubes. 2. Stable diminutive appearing lower trachea and mainstem bronchi due to known extrinsic compression by bulky mediastinal adenopathy. 3. Stable right middle lobe atelectasis obscuring known right lower lobe lung mass. 4. Stable trace bilateral  pleural effusions and left basilar atelectasis. Electronically Signed   By: Ilona Sorrel M.D.   On: 09/02/2017 12:55   Dg Chest Port 1 View  Result Date: 09/01/2017 CLINICAL DATA:  Intubation. EXAM: PORTABLE CHEST 1 VIEW COMPARISON:  08/31/2017.  CT 09/12/2017. FINDINGS: Endotracheal tube and NG tube in stable position. Mediastinal fullness consistent with known adenopathy noted. Right hilar/infrahilar mass again noted. No pleural effusion or pneumothorax. IMPRESSION: 1. Lines and tubes stable position. 2. Findings consist with mediastinal adenopathy and right hilar/ infrahilar mass again noted. No interim change from prior exams. Electronically Signed   By: Marcello Moores  Register   On: 09/01/2017 06:18   Dg Chest Port 1 View  Result Date: 08/31/2017 CLINICAL DATA:  Endotracheal tube EXAM: PORTABLE CHEST 1 VIEW COMPARISON:  Chest x-ray from earlier same day. Chest CT dated 09/21/2017. FINDINGS: Endotracheal tube appears well positioned with tip approximately 3 cm above the carina. Heart size and mediastinal contours are stable. Right perihilar/infrahilar mass is stable in the short-term interval. No new lung findings. No pneumothorax seen. IMPRESSION: Endotracheal tube appears well positioned with tip approximately 3 cm above the carina. No other interval change compared to today's earlier chest x-ray and CT findings. Electronically Signed   By: Franki Cabot M.D.   On: 08/31/2017 19:14   Dg Chest Port 1 View  Result Date: 08/31/2017 CLINICAL DATA:  Respiratory distress.  History of lung cancer. EXAM: PORTABLE CHEST 1 VIEW COMPARISON:  Chest radiographs and CT 09/15/2017  FINDINGS: The cardiac silhouette is normal in size. Right paratracheal and hilar density corresponds to lymphadenopathy demonstrated on CT. Medial right lung base opacity is unchanged from the recent radiographs and corresponds to the known right lower lobe mass and right middle lobe collapse. A calcified granuloma is again noted in the left  lung, which is otherwise clear. No large pleural effusion or pneumothorax is identified. IMPRESSION: Unchanged appearance of the chest. Persistent right middle lobe collapse in the setting of right lower lobe mass and lymphadenopathy. Electronically Signed   By: Logan Bores M.D.   On: 08/31/2017 14:14    Microbiology No results found for this or any previous visit (from the past 240 hour(s)).  Lab Basic Metabolic Panel: Recent Labs  Lab 09/15/2017 1700  09/06/17 0353 09/06/17 1548 09/07/17 0407 09/07/17 0854 09/07/17 1630 09/08/17 0500 09/08/17 1731 09/17/17 0418  NA  --    < > 147*  --  156* 156* 158* 160* 157* 159*  K  --    < > 3.5  --  3.5 3.6 3.4* 3.8 3.9 3.9  CL  --    < > 119*  --  126* 128* 129* >130* >130* >130*  CO2  --    < > 22  --  24 22 23 23 22 24   GLUCOSE  --    < > 288*  --  201* 210* 212* 219* 334* 182*  BUN  --    < > 47*  --  52* 51* 56* 56* 62* 55*  CREATININE  --    < > 1.01  --  0.98 0.93 1.04 1.16 1.28* 1.22  CALCIUM  --    < > 8.7*  --  8.9 8.8* 8.9 9.3 9.2 9.6  MG 2.3  --  2.2 2.4 2.6*  --   --  2.8*  --   --   PHOS 5.3*  --  3.9 4.1 3.8  --   --  3.9  --   --    < > = values in this interval not displayed.   Liver Function Tests: No results for input(s): AST, ALT, ALKPHOS, BILITOT, PROT, ALBUMIN in the last 168 hours. No results for input(s): LIPASE, AMYLASE in the last 168 hours. No results for input(s): AMMONIA in the last 168 hours. CBC: Recent Labs  Lab 09/06/17 0353 09/06/17 1143 09/07/17 0407 09/08/17 0500 09-17-2017 0418  WBC 15.7* 20.2* 18.2* 14.6* 23.9*  NEUTROABS 14.2* 17.8* 15.5* 12.4* 20.8*  HGB 10.0* 10.6* 10.6* 10.7* 12.2*  HCT 32.1* 33.7* 34.1* 35.0* 39.2  MCV 91.2 91.1 92.4 93.1 93.3  PLT 214 224 196 154 135*   Cardiac Enzymes: Recent Labs  Lab 09/06/17 0353 09/06/17 0929 09/06/17 1548  TROPONINI <0.03 <0.03 <0.03   Sepsis Labs: Recent Labs  Lab 09/06/17 1143 09/07/17 0407 09/08/17 0500 2017-09-17 0418  WBC 20.2*  18.2* 14.6* 23.9*    Procedures/Operations  1/7 EBUS > extensive airway tumor in right mainstem, RUL, also a mass seen in the LUL; needle aspiration of the station 7 lymph node showed necrotic tissue, station 4R lymph node showed high grade carcinoma     Simonne Maffucci 09/12/2017, 3:13 PM

## 2017-09-30 NOTE — Progress Notes (Signed)
  Radiation Oncology         [phone]) [phone] ________________________________  Name: Henry [name] MRN: [id number]  Date: 10-03-17  DOB: 1934-10-09  End of Treatment Note  Diagnosis:   83 yo man with presumed right lower lung cancer pending biopsy result, with central airway compromise on a ventilator in need of emergency radiotherapy  Indication for treatment:  Palliative     Radiation treatment dates:   09/06/2017 - 09/08/2017  Site/dose:   Right Bronchus / 30 Gy in 10 fractions. The patient only received 12 Gy due to prematurely ending treatment due to declining condition  Beams/energy:   3D / 10X Photon  Narrative: The patient's status has declined during his course of radiation, and his radiotherapy was discontinued after 4 treatments per patient's wishes.  Plan: The patient has completed radiation treatment. ________________________________  Sheral Apley. Tammi Klippel, M.D.  This document serves as a record of services personally performed by Tyler Pita, MD. It was created on his behalf by Rae Lips, a trained medical scribe. The creation of this record is based on the scribe's personal observations and the provider's statements to them. This document has been checked and approved by the attending provider.

## 2017-09-30 NOTE — Progress Notes (Signed)
   Sep 10, 2017 1400  Clinical Encounter Type  Visited With Patient and family together;Health care provider  Visit Type Patient actively dying  Referral From Nurse  Consult/Referral To Chaplain   Responding to [facility] for End of Life.  Patient was surrounded by family and staff has provided comfort cart.  Family has faith community support, but appreciated a chaplain coming to see them.  Will follow as needed. Chaplain Katherene Ponto

## 2017-09-30 NOTE — Progress Notes (Signed)
Patient passed away at 1756 with family at bedside. Support given to family and questions answered. Verified passing with Esau Grew, RN. Dr. Lake Bells made aware, death checklist completed.

## 2017-09-30 DEATH — deceased

## 2017-10-03 ENCOUNTER — Ambulatory Visit: Payer: Medicare Other

## 2017-10-04 ENCOUNTER — Ambulatory Visit: Payer: Medicare Other

## 2017-10-05 ENCOUNTER — Ambulatory Visit: Payer: Medicare Other

## 2017-10-06 ENCOUNTER — Ambulatory Visit: Payer: Medicare Other

## 2017-10-07 ENCOUNTER — Ambulatory Visit: Payer: Medicare Other

## 2017-10-10 ENCOUNTER — Ambulatory Visit: Payer: Medicare Other

## 2018-11-16 IMAGING — DX DG CHEST 1V PORT
1 series · 1 of 1 positions shown · non-contrast
Comparison: Chest radiographs and CT 08/30/2017

CLINICAL DATA: Respiratory distress.  History of lung cancer.

EXAM:
PORTABLE CHEST 1 VIEW

[chest ap]
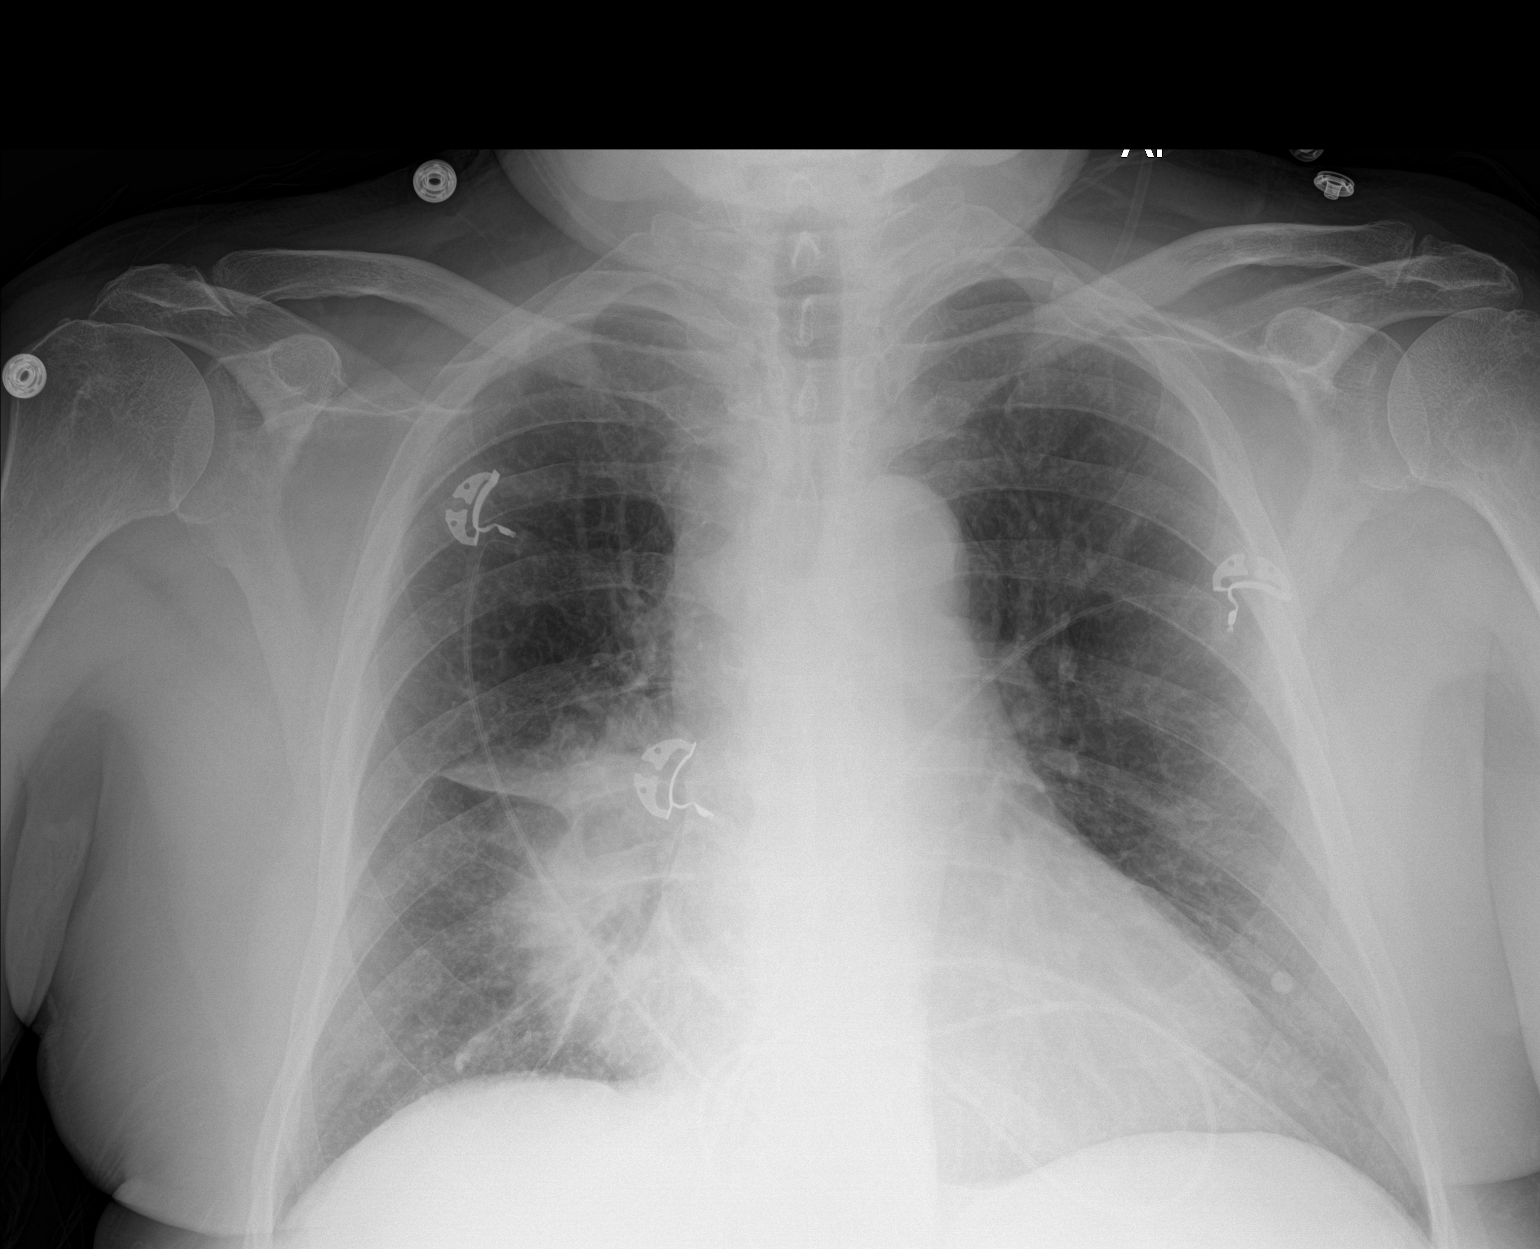

[1 of 1 positions shown; findings below may reference images not displayed]

FINDINGS: The cardiac silhouette is normal in size. Right paratracheal and
hilar density corresponds to lymphadenopathy demonstrated on CT.
Medial right lung base opacity is unchanged from the recent
radiographs and corresponds to the known right lower lobe mass and
right middle lobe collapse. A calcified granuloma is again noted in
the left lung, which is otherwise clear. No large pleural effusion
or pneumothorax is identified.
IMPRESSION: Unchanged appearance of the chest. Persistent right middle lobe
collapse in the setting of right lower lobe mass and
lymphadenopathy.

## 2018-11-19 IMAGING — DX DG CHEST 1V PORT
1 series · 1 of 1 positions shown · non-contrast
Comparison: 09/02/2017.

CLINICAL DATA: Pneumonia, diabetes.

EXAM:
PORTABLE CHEST 1 VIEW

[chest ap]
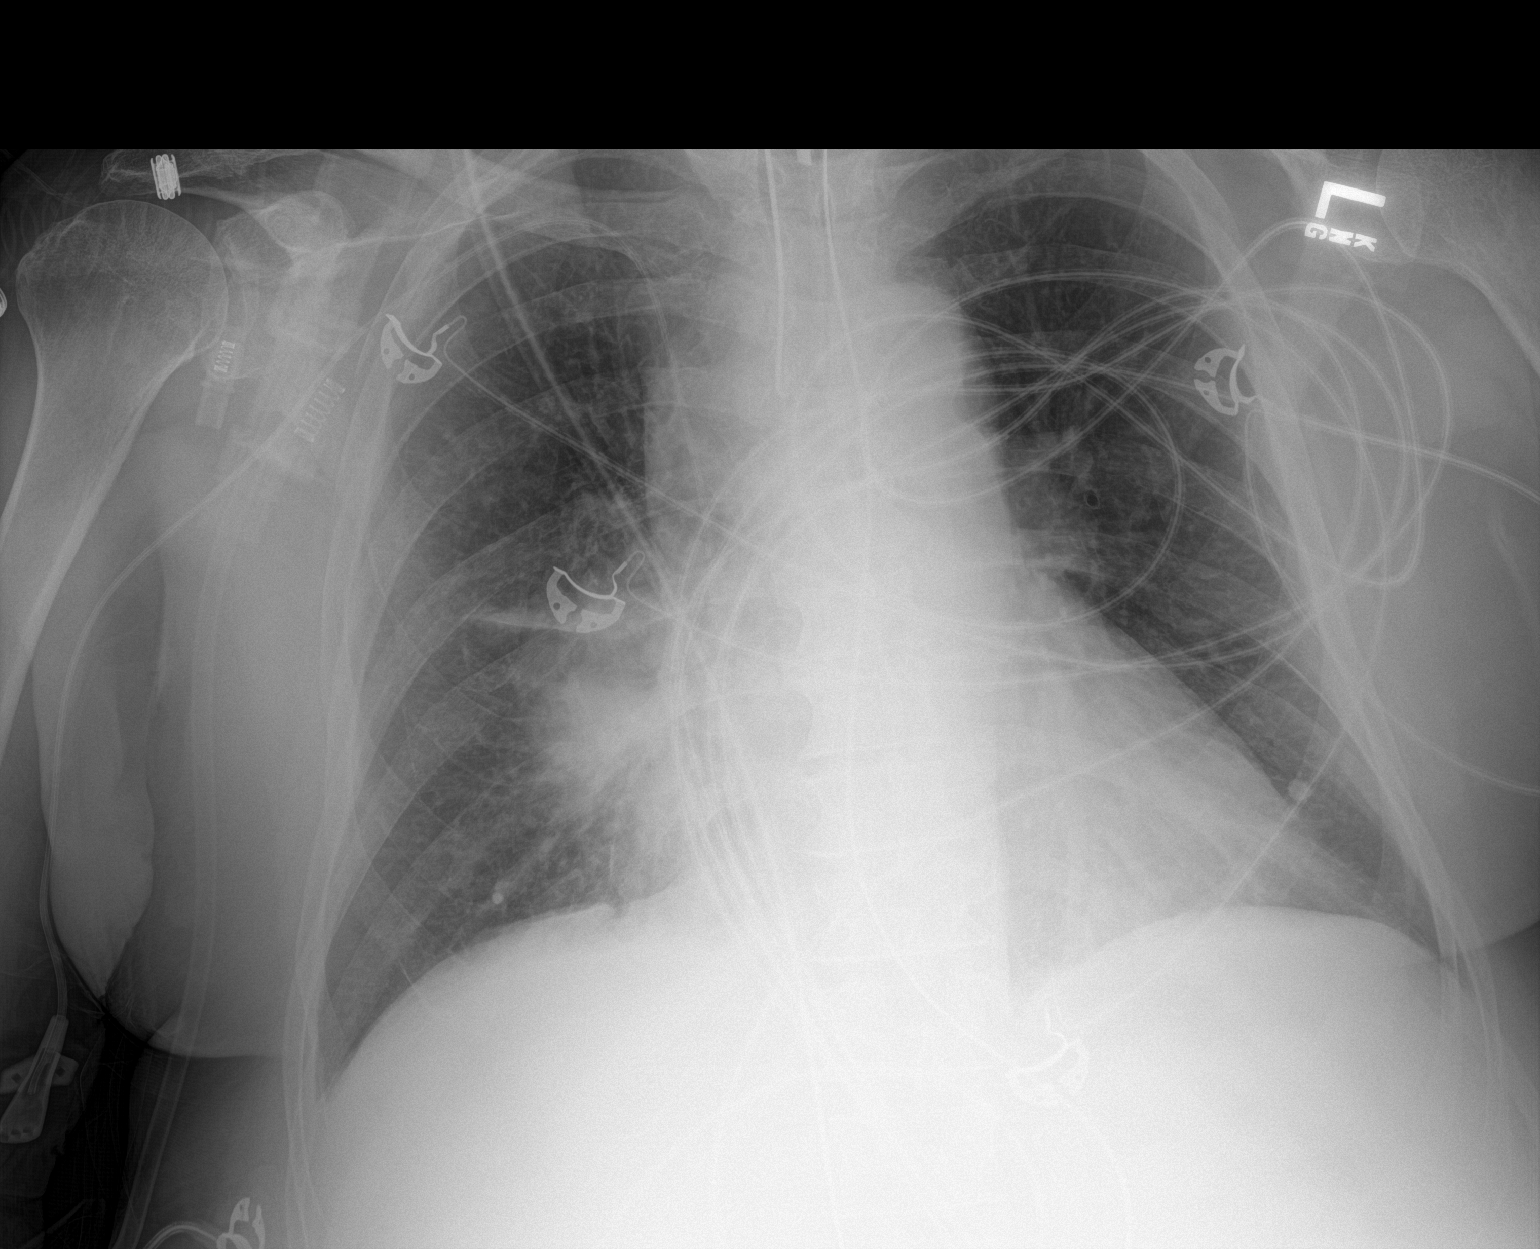

[1 of 1 positions shown; findings below may reference images not displayed]

FINDINGS: Normal cardiac size. Tubes and lines appear stable. RIGHT perihilar
opacity is unchanged.
IMPRESSION: Stable exam.

## 2018-11-21 IMAGING — DX DG ABDOMEN 1V
1 series · 1 of 1 positions shown · non-contrast
Comparison: 08/31/2017

CLINICAL DATA: 82-year-old male with a history of nasogastric tube
placement

EXAM:
ABDOMEN - 1 VIEW

[abdomen kub]
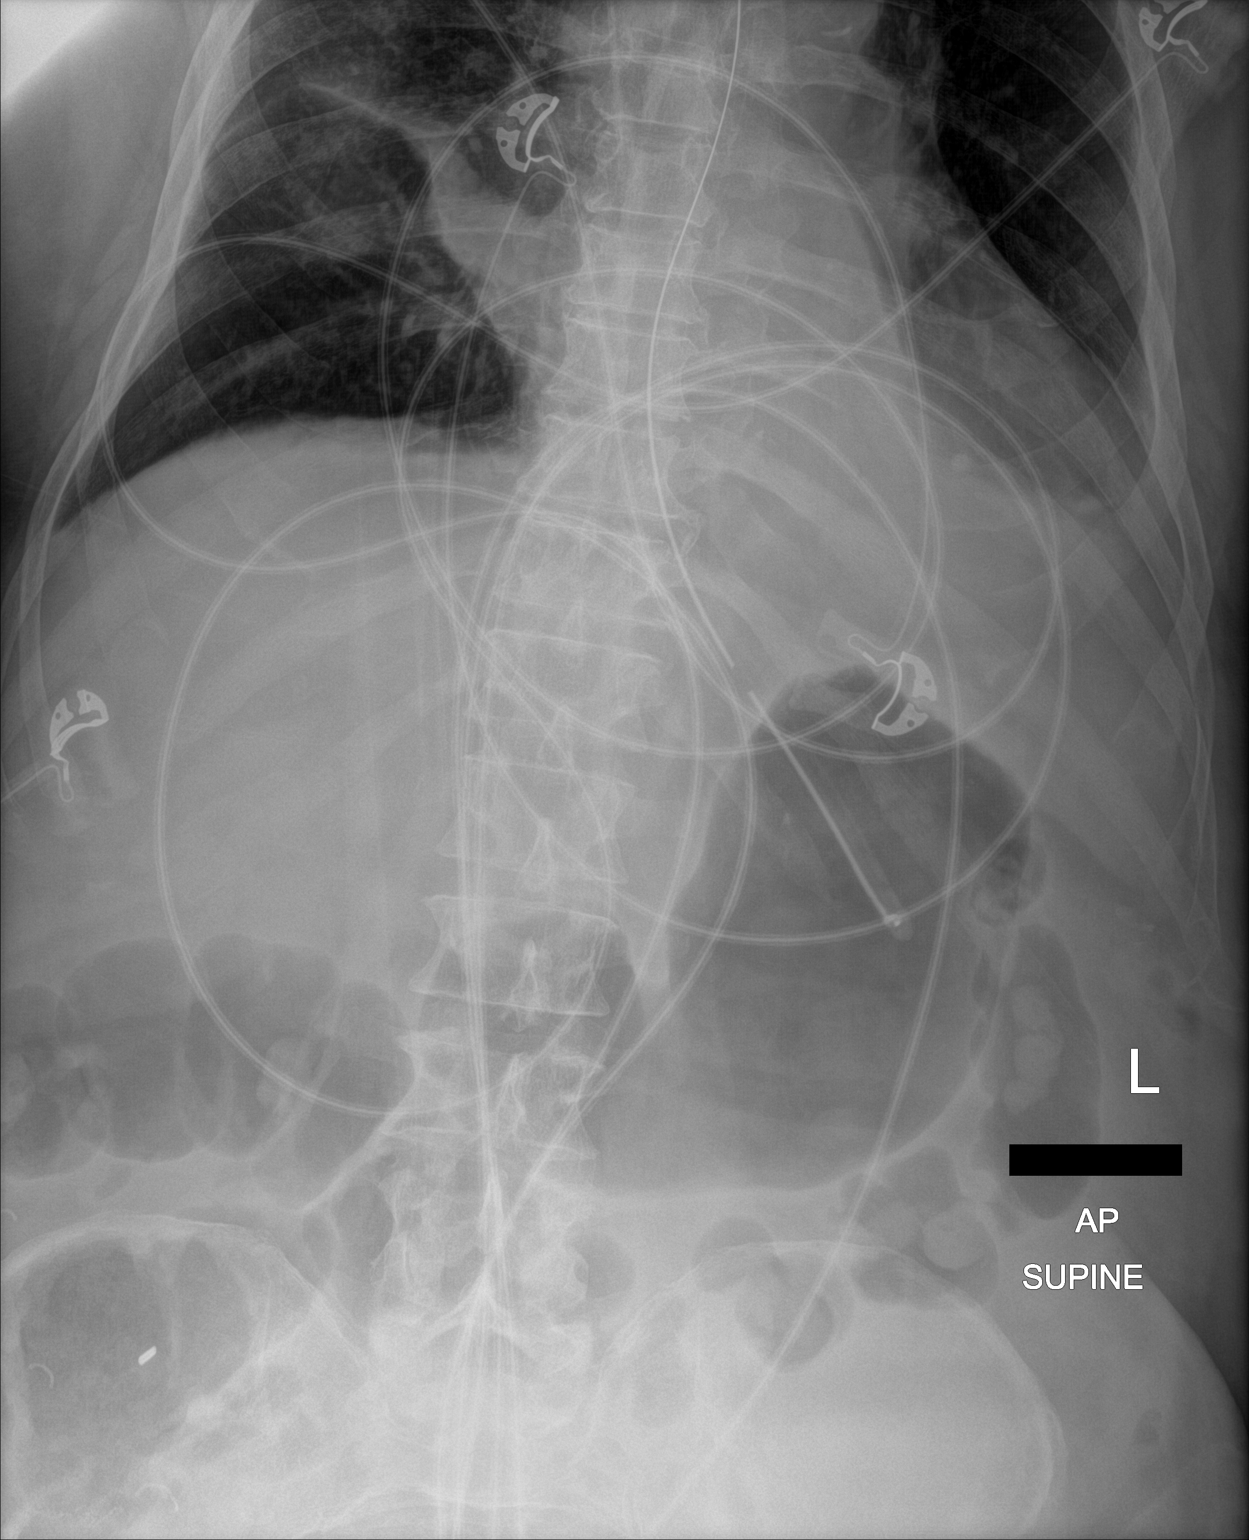

[1 of 1 positions shown; findings below may reference images not displayed]

FINDINGS: Limited images of upper abdomen.

Nasogastric tube terminates in the left upper quadrant. The side
port of the nasogastric tube appears beyond the region of the GE
junction, below the diaphragms.

Gas within stomach and colon.
IMPRESSION: Limited plain film abdomen demonstrates nasogastric tube terminating
in the stomach, with the side-port appearing to be beyond the region
of the GE junction.

## 2018-11-24 IMAGING — DX DG CHEST 1V PORT
1 series · 2 of 2 positions shown · non-contrast
Comparison: 09/07/2017

CLINICAL DATA: Respiratory failure with hypoxemia. Endotracheal
tube position

EXAM:
PORTABLE CHEST 1 VIEW

[Series 1: chest ap · 0.14mm/px · 2 of 2 slices shown]
[im 1/2]
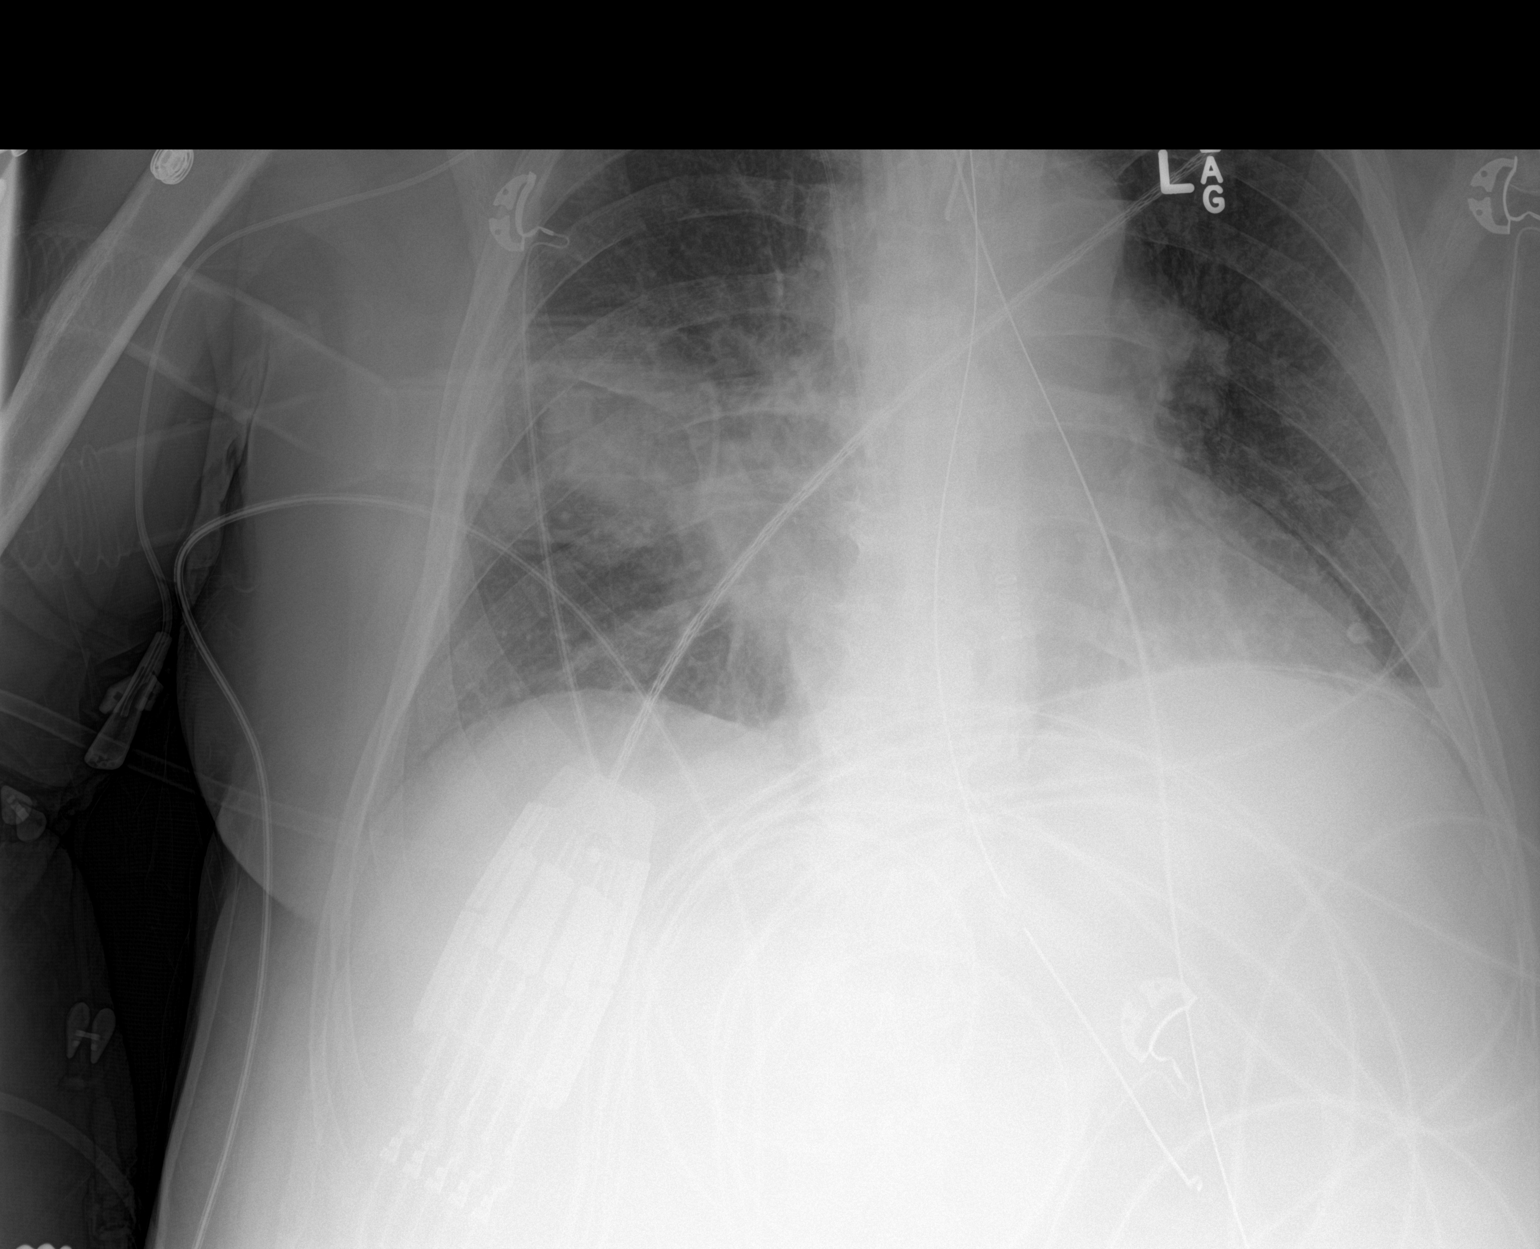
[im 2/2]
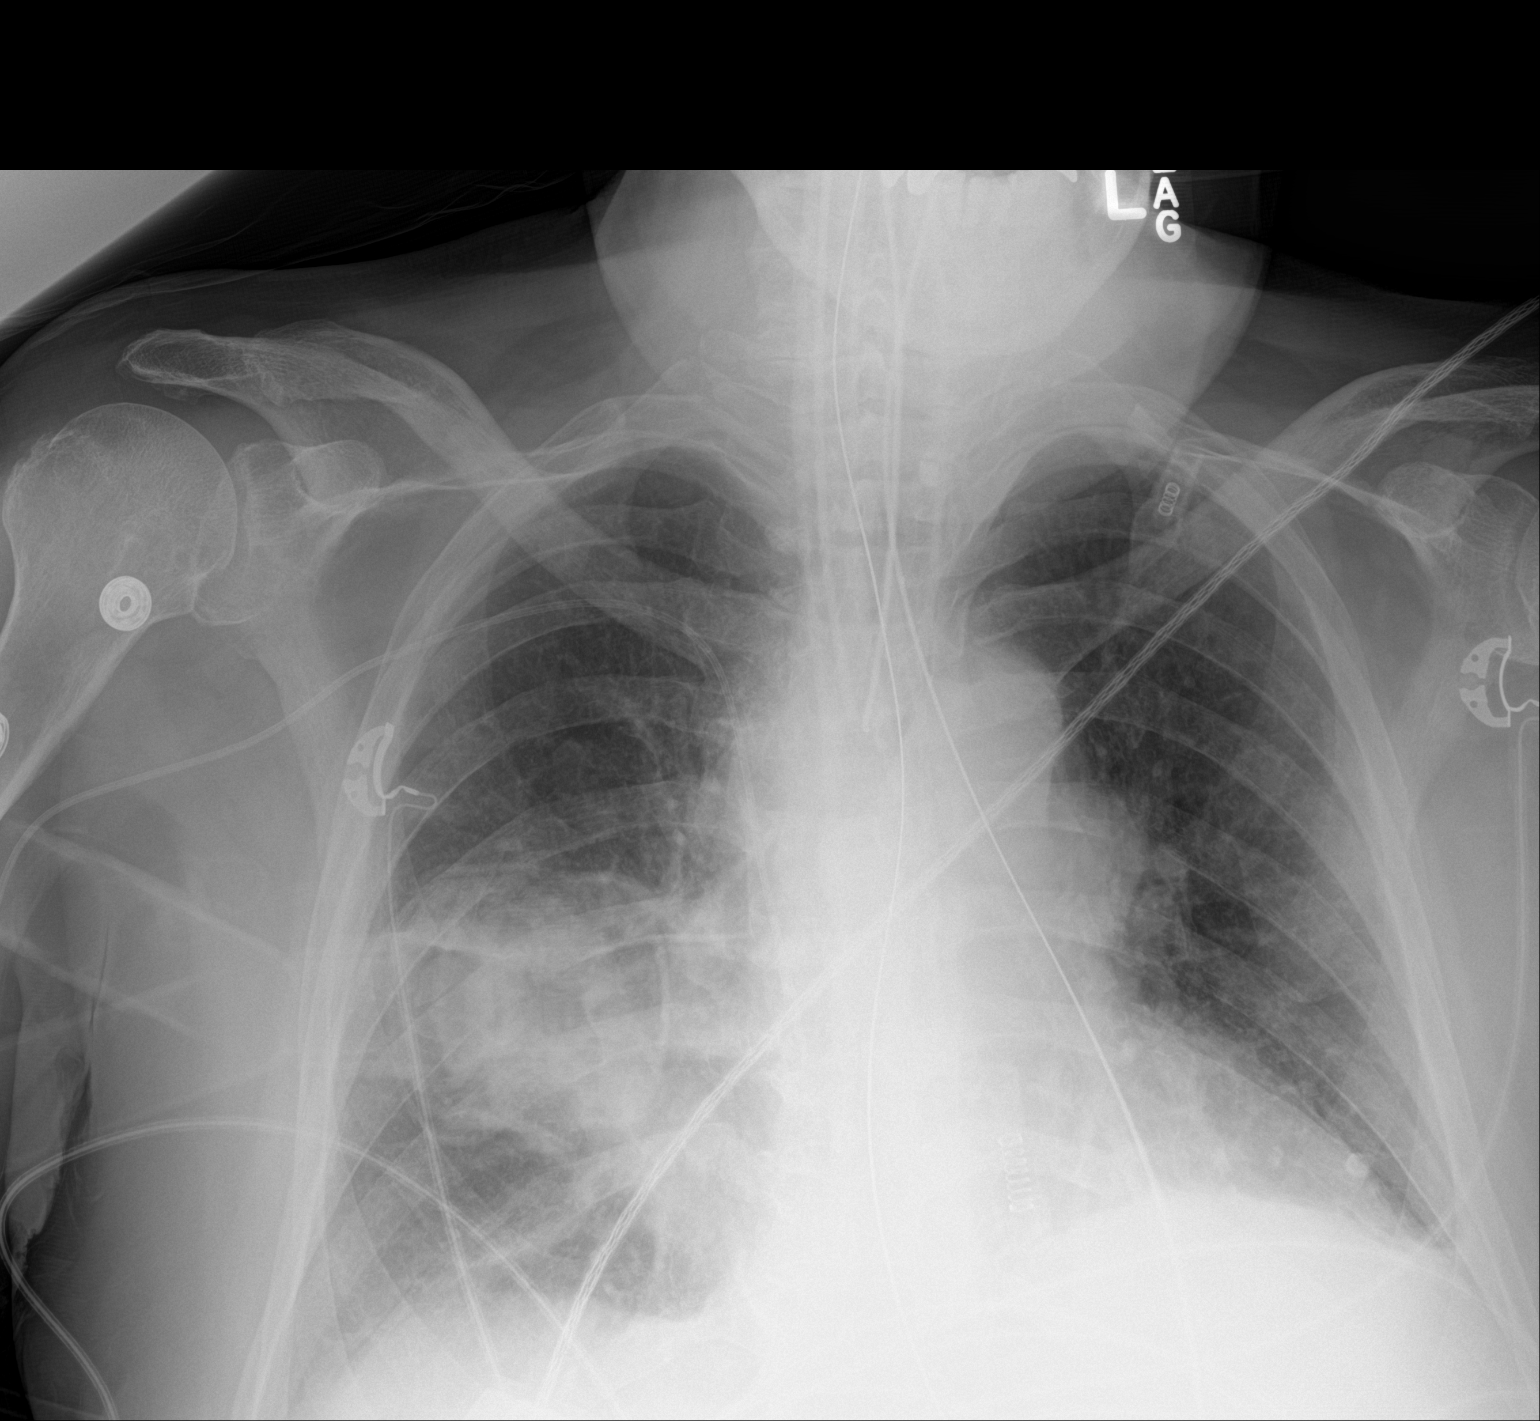

[2 of 2 positions shown; findings below may reference images not displayed]

FINDINGS: Endotracheal tube in good position. NG in the stomach. Right arm
PICC tip in the SVC

Right basilar lung mass and right hilar adenopathy unchanged. Mild
bibasilar atelectasis. Negative for heart failure or effusion
IMPRESSION: Endotracheal tube in good position.  Right lung mass unchanged.

## 2018-11-25 IMAGING — DX DG CHEST 1V PORT
1 series · 1 of 1 positions shown · non-contrast
Comparison: September 08, 2017

CLINICAL DATA: Respiratory failure

EXAM:
PORTABLE CHEST 1 VIEW

[chest ap]
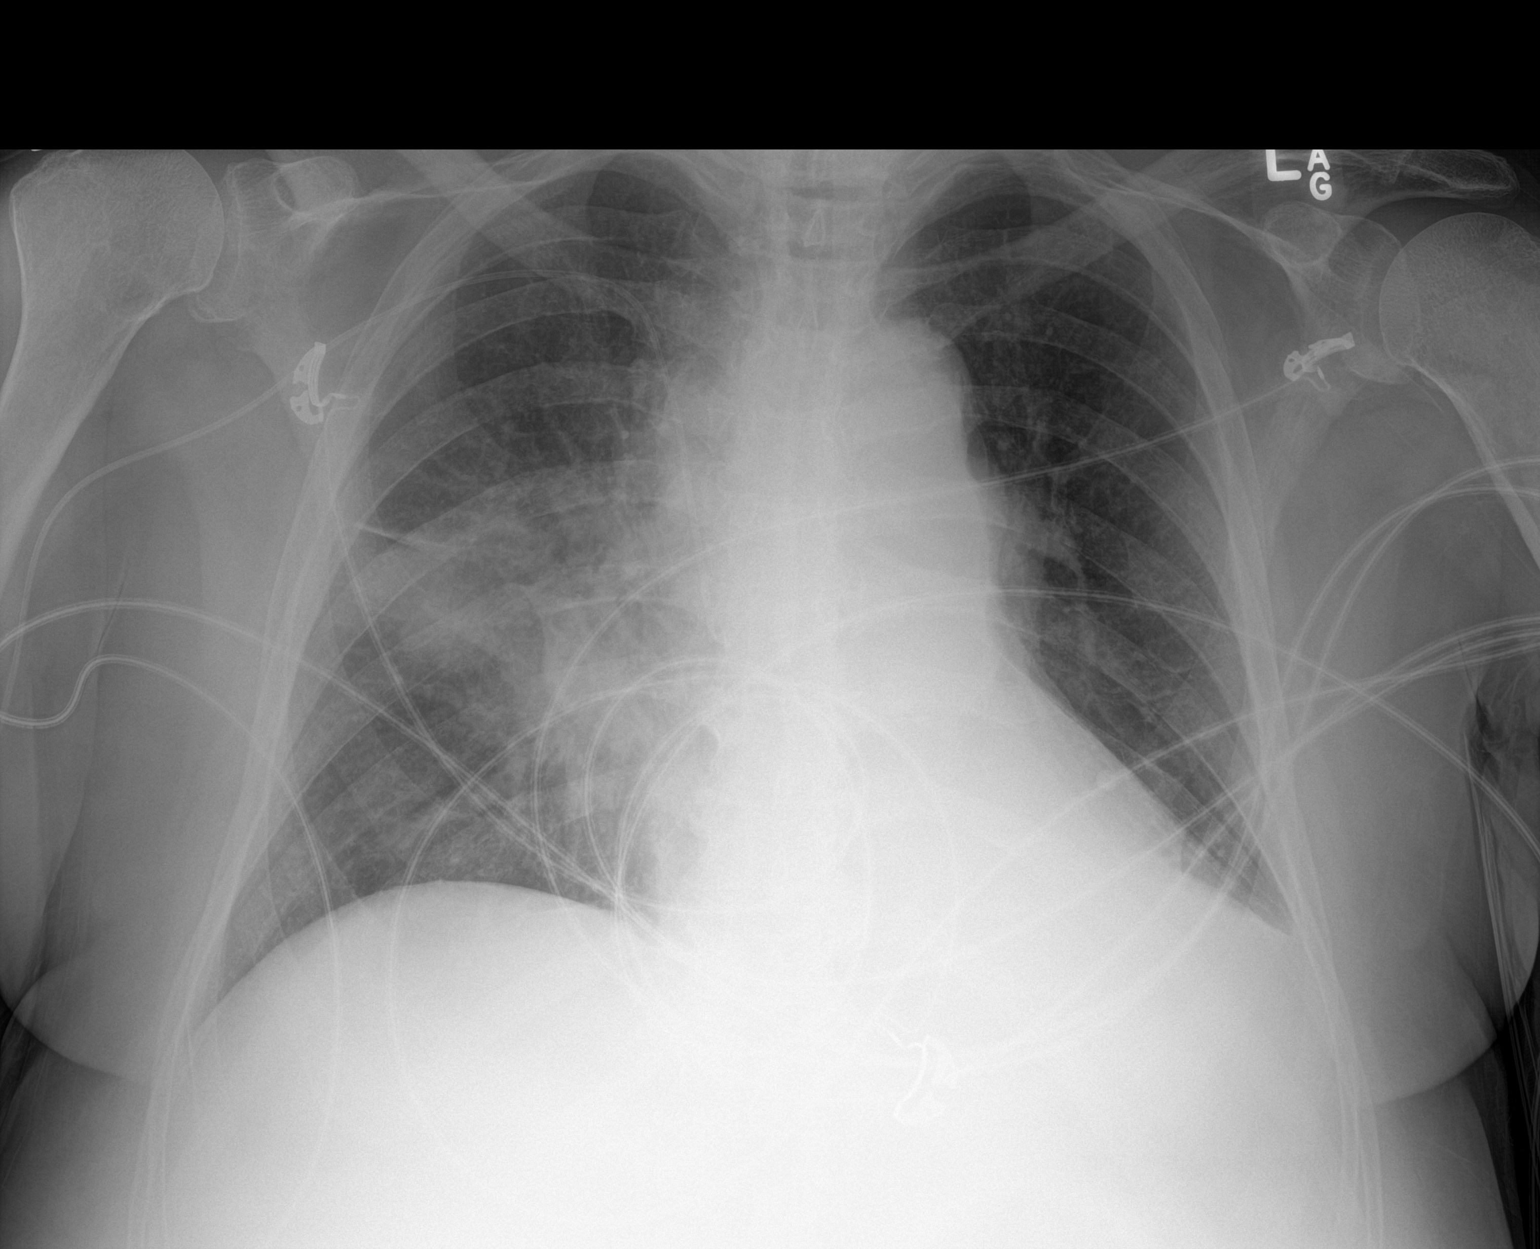

[1 of 1 positions shown; findings below may reference images not displayed]

FINDINGS: Endotracheal tube and nasogastric tube have been removed. Central
catheter tip is in the superior vena cava. No pneumothorax. The
previously noted mass in the right lower lung zone with adjacent
consolidation persists without change. Right hilar fullness is
likewise stable. Left lung remains clear. No new opacity on either
side. Heart is upper normal in size with pulmonary vascularity
within normal limits. No bone lesions.
IMPRESSION: No pneumothorax. Central catheter position unchanged. Right lower
lobe mass with right hilar fullness persists with patchy surrounding
pneumonitis. No new opacity. Stable cardiac silhouette.
